# Patient Record
Sex: Female | Born: 1960 | Race: White | Hispanic: No | Marital: Married | Smoking: Never smoker
Health system: Southern US, Community
[De-identification: ages and names within clinical notes are randomized; demographics above are authoritative.]

## PROBLEM LIST (undated history)

## (undated) DIAGNOSIS — T8859XA Other complications of anesthesia, initial encounter: Secondary | ICD-10-CM

## (undated) DIAGNOSIS — J45909 Unspecified asthma, uncomplicated: Secondary | ICD-10-CM

## (undated) DIAGNOSIS — F419 Anxiety disorder, unspecified: Secondary | ICD-10-CM

## (undated) DIAGNOSIS — Z9889 Other specified postprocedural states: Secondary | ICD-10-CM

## (undated) DIAGNOSIS — F32A Depression, unspecified: Secondary | ICD-10-CM

---

## 2000-06-28 ENCOUNTER — Encounter: Admission: RE | Admit: 2000-06-28 | Discharge: 2000-06-28 | Payer: Self-pay | Admitting: Obstetrics and Gynecology

## 2000-06-28 ENCOUNTER — Encounter: Payer: Self-pay | Admitting: Obstetrics and Gynecology

## 2001-07-04 ENCOUNTER — Encounter: Payer: Self-pay | Admitting: Obstetrics and Gynecology

## 2001-07-04 ENCOUNTER — Encounter: Admission: RE | Admit: 2001-07-04 | Discharge: 2001-07-04 | Payer: Self-pay | Admitting: Obstetrics and Gynecology

## 2002-07-08 ENCOUNTER — Encounter: Admission: RE | Admit: 2002-07-08 | Discharge: 2002-07-08 | Payer: Self-pay | Admitting: Obstetrics and Gynecology

## 2002-07-08 ENCOUNTER — Encounter: Payer: Self-pay | Admitting: Obstetrics and Gynecology

## 2003-07-13 ENCOUNTER — Encounter: Admission: RE | Admit: 2003-07-13 | Discharge: 2003-07-13 | Payer: Self-pay | Admitting: Obstetrics and Gynecology

## 2004-07-22 ENCOUNTER — Encounter: Admission: RE | Admit: 2004-07-22 | Discharge: 2004-07-22 | Payer: Self-pay | Admitting: Obstetrics and Gynecology

## 2005-07-12 ENCOUNTER — Ambulatory Visit: Payer: Self-pay | Admitting: Gastroenterology

## 2005-08-11 ENCOUNTER — Ambulatory Visit: Payer: Self-pay | Admitting: Gastroenterology

## 2005-08-14 ENCOUNTER — Encounter: Admission: RE | Admit: 2005-08-14 | Discharge: 2005-08-14 | Payer: Self-pay | Admitting: Obstetrics and Gynecology

## 2005-08-17 ENCOUNTER — Ambulatory Visit: Payer: Self-pay | Admitting: Gastroenterology

## 2005-08-24 ENCOUNTER — Ambulatory Visit: Payer: Self-pay | Admitting: Gastroenterology

## 2005-09-29 ENCOUNTER — Ambulatory Visit: Payer: Self-pay | Admitting: Gastroenterology

## 2006-08-17 ENCOUNTER — Encounter: Admission: RE | Admit: 2006-08-17 | Discharge: 2006-08-17 | Payer: Self-pay | Admitting: Obstetrics and Gynecology

## 2007-09-12 ENCOUNTER — Encounter: Admission: RE | Admit: 2007-09-12 | Discharge: 2007-09-12 | Payer: Self-pay | Admitting: Obstetrics and Gynecology

## 2009-01-12 ENCOUNTER — Encounter: Admission: RE | Admit: 2009-01-12 | Discharge: 2009-01-12 | Payer: Self-pay | Admitting: Obstetrics and Gynecology

## 2010-04-03 ENCOUNTER — Emergency Department (HOSPITAL_COMMUNITY): Admission: EM | Admit: 2010-04-03 | Discharge: 2010-04-03 | Payer: Self-pay | Admitting: Emergency Medicine

## 2010-04-06 ENCOUNTER — Encounter: Admission: RE | Admit: 2010-04-06 | Discharge: 2010-04-06 | Payer: Self-pay | Admitting: Obstetrics and Gynecology

## 2010-07-10 ENCOUNTER — Encounter: Payer: Self-pay | Admitting: Obstetrics and Gynecology

## 2011-04-03 ENCOUNTER — Other Ambulatory Visit: Payer: Self-pay | Admitting: Obstetrics and Gynecology

## 2011-04-03 DIAGNOSIS — Z1231 Encounter for screening mammogram for malignant neoplasm of breast: Secondary | ICD-10-CM

## 2011-04-25 ENCOUNTER — Ambulatory Visit
Admission: RE | Admit: 2011-04-25 | Discharge: 2011-04-25 | Disposition: A | Discharge status: Home / Self Care | Payer: Managed Care, Other (non HMO) | Source: Ambulatory Visit | Attending: Obstetrics and Gynecology | Admitting: Obstetrics and Gynecology

## 2011-04-25 DIAGNOSIS — Z1231 Encounter for screening mammogram for malignant neoplasm of breast: Secondary | ICD-10-CM

## 2012-03-15 ENCOUNTER — Encounter: Payer: Self-pay | Admitting: Gastroenterology

## 2012-04-16 ENCOUNTER — Other Ambulatory Visit: Payer: Self-pay | Admitting: Obstetrics and Gynecology

## 2012-04-16 DIAGNOSIS — Z803 Family history of malignant neoplasm of breast: Secondary | ICD-10-CM

## 2012-04-16 DIAGNOSIS — Z1231 Encounter for screening mammogram for malignant neoplasm of breast: Secondary | ICD-10-CM

## 2012-05-17 ENCOUNTER — Ambulatory Visit
Admission: RE | Admit: 2012-05-17 | Discharge: 2012-05-17 | Disposition: A | Discharge status: Home / Self Care | Payer: Commercial Indemnity | Source: Ambulatory Visit | Attending: Obstetrics and Gynecology | Admitting: Obstetrics and Gynecology

## 2012-05-17 DIAGNOSIS — Z803 Family history of malignant neoplasm of breast: Secondary | ICD-10-CM

## 2012-05-17 DIAGNOSIS — Z1231 Encounter for screening mammogram for malignant neoplasm of breast: Secondary | ICD-10-CM

## 2012-06-19 HISTORY — PX: COLONOSCOPY: SHX174

## 2012-09-10 ENCOUNTER — Other Ambulatory Visit: Payer: Self-pay | Admitting: Family Medicine

## 2012-09-10 ENCOUNTER — Ambulatory Visit
Admission: RE | Admit: 2012-09-10 | Discharge: 2012-09-10 | Disposition: A | Discharge status: Home / Self Care | Payer: Self-pay | Source: Ambulatory Visit | Attending: Family Medicine | Admitting: Family Medicine

## 2012-09-10 DIAGNOSIS — R079 Chest pain, unspecified: Secondary | ICD-10-CM

## 2012-12-11 ENCOUNTER — Encounter (INDEPENDENT_AMBULATORY_CARE_PROVIDER_SITE_OTHER): Payer: Self-pay

## 2012-12-16 ENCOUNTER — Encounter (INDEPENDENT_AMBULATORY_CARE_PROVIDER_SITE_OTHER): Payer: Self-pay | Admitting: General Surgery

## 2012-12-16 ENCOUNTER — Ambulatory Visit (INDEPENDENT_AMBULATORY_CARE_PROVIDER_SITE_OTHER): Payer: BC Managed Care – PPO | Admitting: General Surgery

## 2012-12-16 VITALS — BP 110/80 | HR 82 | Resp 16 | Ht 65.5 in | Wt 152.4 lb

## 2012-12-16 DIAGNOSIS — R1031 Right lower quadrant pain: Secondary | ICD-10-CM

## 2012-12-16 MED ORDER — TRAMADOL HCL 50 MG PO TABS: 50.0000 mg | ORAL_TABLET | Freq: Four times a day (QID) | ORAL | Status: DC | PRN

## 2012-12-16 NOTE — Progress Notes (Signed)
Subjective:     Patient ID: Heidi King, female   DOB: 02-Nov-1960, 52 y.o.   MRN: [id number]  HPI We're asked to see the patient in consultation by Dr. Creta Levin to evaluate her for a right groin hernia. The patient is a 52 year old white female who first started having pain in the right groin about 2 months ago. She states that she didn't help her husband something heavy at home at about the same time. She does not recall feeling a pop or acute pain at the time of lifting the barrel. She has had some nausea associated with it. She denies any vomiting. Her stools are been regular but loose or the normal. She denies any fevers or chills.  Review of Systems  Constitutional: Negative.   HENT: Negative.   Eyes: Negative.   Respiratory: Negative.   Cardiovascular: Negative.   Gastrointestinal: Positive for abdominal pain and diarrhea.  Endocrine: Negative.   Genitourinary: Negative.   Musculoskeletal: Negative.   Skin: Negative.   Allergic/Immunologic: Negative.   Neurological: Negative.   Hematological: Negative.   Psychiatric/Behavioral: Negative.        Objective:   Physical Exam  Constitutional: She is oriented to person, place, and time. She appears well-developed and well-nourished.  HENT:  Head: Normocephalic and atraumatic.  Eyes: Conjunctivae and EOM are normal. Pupils are equal, round, and reactive to light.  Neck: Normal range of motion. Neck supple.  Cardiovascular: Normal rate, regular rhythm and normal heart sounds.   Pulmonary/Chest: Effort normal and breath sounds normal.  Abdominal: Soft. Bowel sounds are normal.  She has moderate to severe tenderness in the right groin with palpation. There is no obvious bulge or impulse with straining in the right groin. The rest of her abdomen is soft and nontender.  Musculoskeletal: Normal range of motion.  Neurological: She is alert and oriented to person, place, and time.  Skin: Skin is warm and dry.  Psychiatric: She has a  normal mood and affect. Her behavior is normal.       Assessment:     The patient is having significant right groin pain. Unfortunately I cannot appreciate a hernia on physical exam. Because of this I think she should undergo a CT scan of her pelvis to look for radiographic evidence of a hernia     Plan:     Plan for CT scan of the pelvis. We will call her with the results of the study and then proceed accordingly.

## 2012-12-16 NOTE — Patient Instructions (Signed)
Will get CT pelvis and call with results

## 2012-12-16 NOTE — Addendum Note (Signed)
Addended by: Caleen Essex III on: 12/16/2012 03:31 PM   Modules accepted: Orders

## 2012-12-23 ENCOUNTER — Ambulatory Visit
Admission: RE | Admit: 2012-12-23 | Discharge: 2012-12-23 | Disposition: A | Discharge status: Home / Self Care | Payer: BC Managed Care – PPO | Source: Ambulatory Visit | Attending: General Surgery | Admitting: General Surgery

## 2012-12-23 DIAGNOSIS — R1031 Right lower quadrant pain: Secondary | ICD-10-CM

## 2012-12-23 MED ORDER — IOHEXOL 300 MG/ML  SOLN
100.0000 mL | Freq: Once | INTRAMUSCULAR | Status: AC | PRN
  Administered 2012-12-23: 100 mL via INTRAVENOUS

## 2012-12-25 ENCOUNTER — Telehealth (INDEPENDENT_AMBULATORY_CARE_PROVIDER_SITE_OTHER): Payer: Self-pay

## 2012-12-25 NOTE — Telephone Encounter (Signed)
LMOM. CT results from this past Monday were normal. No hernia found or anything to account for her pain that needs surgical correction.

## 2012-12-25 NOTE — Telephone Encounter (Signed)
Message copied by Brennan Bailey on Wed Dec 25, 2012  3:28 PM ------      Message from: Louie Casa      Created: Wed Dec 25, 2012 11:12 AM      Regarding: Dr. Carolynne Edouard CT scan results      Contact: [phone]       Patient had Ct scan last Monday and no one call her with the results she wants a call Today please.      Thank you. ------

## 2012-12-26 ENCOUNTER — Telehealth (INDEPENDENT_AMBULATORY_CARE_PROVIDER_SITE_OTHER): Payer: Self-pay

## 2012-12-26 NOTE — Telephone Encounter (Signed)
Called patient back and gave her negative CT results.

## 2013-06-23 ENCOUNTER — Other Ambulatory Visit: Payer: Self-pay

## 2013-06-23 DIAGNOSIS — Z1231 Encounter for screening mammogram for malignant neoplasm of breast: Secondary | ICD-10-CM

## 2013-07-11 ENCOUNTER — Ambulatory Visit
Admission: RE | Admit: 2013-07-11 | Discharge: 2013-07-11 | Disposition: A | Discharge status: Home / Self Care | Payer: BC Managed Care – PPO | Source: Ambulatory Visit

## 2013-07-11 DIAGNOSIS — Z1231 Encounter for screening mammogram for malignant neoplasm of breast: Secondary | ICD-10-CM

## 2014-06-05 ENCOUNTER — Other Ambulatory Visit: Payer: Self-pay

## 2014-06-05 DIAGNOSIS — Z1231 Encounter for screening mammogram for malignant neoplasm of breast: Secondary | ICD-10-CM

## 2014-07-13 ENCOUNTER — Ambulatory Visit
Admission: RE | Admit: 2014-07-13 | Discharge: 2014-07-13 | Disposition: A | Discharge status: Home / Self Care | Payer: BLUE CROSS/BLUE SHIELD | Source: Ambulatory Visit

## 2014-07-13 DIAGNOSIS — Z1231 Encounter for screening mammogram for malignant neoplasm of breast: Secondary | ICD-10-CM

## 2015-09-10 ENCOUNTER — Other Ambulatory Visit: Payer: Self-pay

## 2015-09-10 DIAGNOSIS — Z1231 Encounter for screening mammogram for malignant neoplasm of breast: Secondary | ICD-10-CM

## 2015-09-23 ENCOUNTER — Ambulatory Visit
Admission: RE | Admit: 2015-09-23 | Discharge: 2015-09-23 | Disposition: A | Discharge status: Home / Self Care | Payer: BLUE CROSS/BLUE SHIELD | Source: Ambulatory Visit

## 2015-09-23 DIAGNOSIS — Z1231 Encounter for screening mammogram for malignant neoplasm of breast: Secondary | ICD-10-CM

## 2016-10-23 ENCOUNTER — Other Ambulatory Visit: Payer: Self-pay | Admitting: Obstetrics and Gynecology

## 2016-10-23 DIAGNOSIS — Z1231 Encounter for screening mammogram for malignant neoplasm of breast: Secondary | ICD-10-CM

## 2016-11-15 ENCOUNTER — Ambulatory Visit
Admission: RE | Admit: 2016-11-15 | Discharge: 2016-11-15 | Disposition: A | Discharge status: Home / Self Care | Payer: BLUE CROSS/BLUE SHIELD | Source: Ambulatory Visit | Attending: Obstetrics and Gynecology | Admitting: Obstetrics and Gynecology

## 2016-11-15 DIAGNOSIS — Z1231 Encounter for screening mammogram for malignant neoplasm of breast: Secondary | ICD-10-CM

## 2017-04-13 ENCOUNTER — Other Ambulatory Visit: Payer: Self-pay | Admitting: Physician Assistant

## 2017-04-13 ENCOUNTER — Ambulatory Visit
Admission: RE | Admit: 2017-04-13 | Discharge: 2017-04-13 | Disposition: A | Discharge status: Home / Self Care | Payer: BLUE CROSS/BLUE SHIELD | Source: Ambulatory Visit | Attending: Physician Assistant | Admitting: Physician Assistant

## 2017-04-13 DIAGNOSIS — M545 Low back pain, unspecified: Secondary | ICD-10-CM

## 2017-06-19 HISTORY — PX: KNEE ARTHROSCOPY: SHX127

## 2017-11-28 ENCOUNTER — Other Ambulatory Visit: Payer: Self-pay | Admitting: Obstetrics and Gynecology

## 2017-11-28 DIAGNOSIS — R928 Other abnormal and inconclusive findings on diagnostic imaging of breast: Secondary | ICD-10-CM

## 2017-11-30 ENCOUNTER — Ambulatory Visit
Admission: RE | Admit: 2017-11-30 | Discharge: 2017-11-30 | Disposition: A | Discharge status: Home / Self Care | Payer: BLUE CROSS/BLUE SHIELD | Source: Ambulatory Visit | Attending: Obstetrics and Gynecology | Admitting: Obstetrics and Gynecology

## 2017-11-30 ENCOUNTER — Ambulatory Visit: Payer: BLUE CROSS/BLUE SHIELD

## 2017-11-30 DIAGNOSIS — R928 Other abnormal and inconclusive findings on diagnostic imaging of breast: Secondary | ICD-10-CM

## 2018-07-02 ENCOUNTER — Other Ambulatory Visit: Payer: Self-pay | Admitting: Neurosurgery

## 2018-07-02 DIAGNOSIS — M5441 Lumbago with sciatica, right side: Secondary | ICD-10-CM

## 2018-07-04 ENCOUNTER — Ambulatory Visit
Admission: RE | Admit: 2018-07-04 | Discharge: 2018-07-04 | Disposition: A | Discharge status: Home / Self Care | Payer: BLUE CROSS/BLUE SHIELD | Source: Ambulatory Visit | Attending: Neurosurgery | Admitting: Neurosurgery

## 2018-07-04 DIAGNOSIS — M5441 Lumbago with sciatica, right side: Secondary | ICD-10-CM

## 2018-09-30 ENCOUNTER — Other Ambulatory Visit: Payer: Self-pay | Admitting: Physician Assistant

## 2018-09-30 ENCOUNTER — Ambulatory Visit
Admission: RE | Admit: 2018-09-30 | Discharge: 2018-09-30 | Disposition: A | Discharge status: Home / Self Care | Payer: BLUE CROSS/BLUE SHIELD | Source: Ambulatory Visit | Attending: Physician Assistant | Admitting: Physician Assistant

## 2018-09-30 ENCOUNTER — Other Ambulatory Visit: Payer: Self-pay

## 2018-09-30 DIAGNOSIS — R059 Cough, unspecified: Secondary | ICD-10-CM

## 2018-09-30 DIAGNOSIS — R05 Cough: Secondary | ICD-10-CM

## 2019-01-15 ENCOUNTER — Other Ambulatory Visit: Payer: Self-pay | Admitting: Neurosurgery

## 2019-01-15 DIAGNOSIS — M47816 Spondylosis without myelopathy or radiculopathy, lumbar region: Secondary | ICD-10-CM

## 2019-01-20 ENCOUNTER — Other Ambulatory Visit: Payer: Self-pay

## 2019-01-20 ENCOUNTER — Ambulatory Visit
Admission: RE | Admit: 2019-01-20 | Discharge: 2019-01-20 | Disposition: A | Discharge status: Home / Self Care | Payer: BLUE CROSS/BLUE SHIELD | Source: Ambulatory Visit | Attending: Neurosurgery | Admitting: Neurosurgery

## 2019-01-20 DIAGNOSIS — M47816 Spondylosis without myelopathy or radiculopathy, lumbar region: Secondary | ICD-10-CM

## 2019-01-27 ENCOUNTER — Other Ambulatory Visit: Payer: BLUE CROSS/BLUE SHIELD

## 2019-01-28 ENCOUNTER — Other Ambulatory Visit: Payer: BLUE CROSS/BLUE SHIELD

## 2019-09-03 ENCOUNTER — Ambulatory Visit
Admission: RE | Admit: 2019-09-03 | Discharge: 2019-09-03 | Disposition: A | Discharge status: Home / Self Care | Payer: BLUE CROSS/BLUE SHIELD | Source: Ambulatory Visit | Attending: Physician Assistant | Admitting: Physician Assistant

## 2019-09-03 ENCOUNTER — Other Ambulatory Visit: Payer: Self-pay

## 2019-09-03 ENCOUNTER — Other Ambulatory Visit: Payer: Self-pay | Admitting: Physician Assistant

## 2019-09-03 DIAGNOSIS — R52 Pain, unspecified: Secondary | ICD-10-CM

## 2020-04-17 IMAGING — CR DG KNEE 1-2V*R*
2 series · 2 of 2 positions shown · non-contrast
Comparison: None.

CLINICAL DATA: Chronic right medial knee pain

EXAM:
RIGHT KNEE - 1-2 VIEW

[w knee ap right]
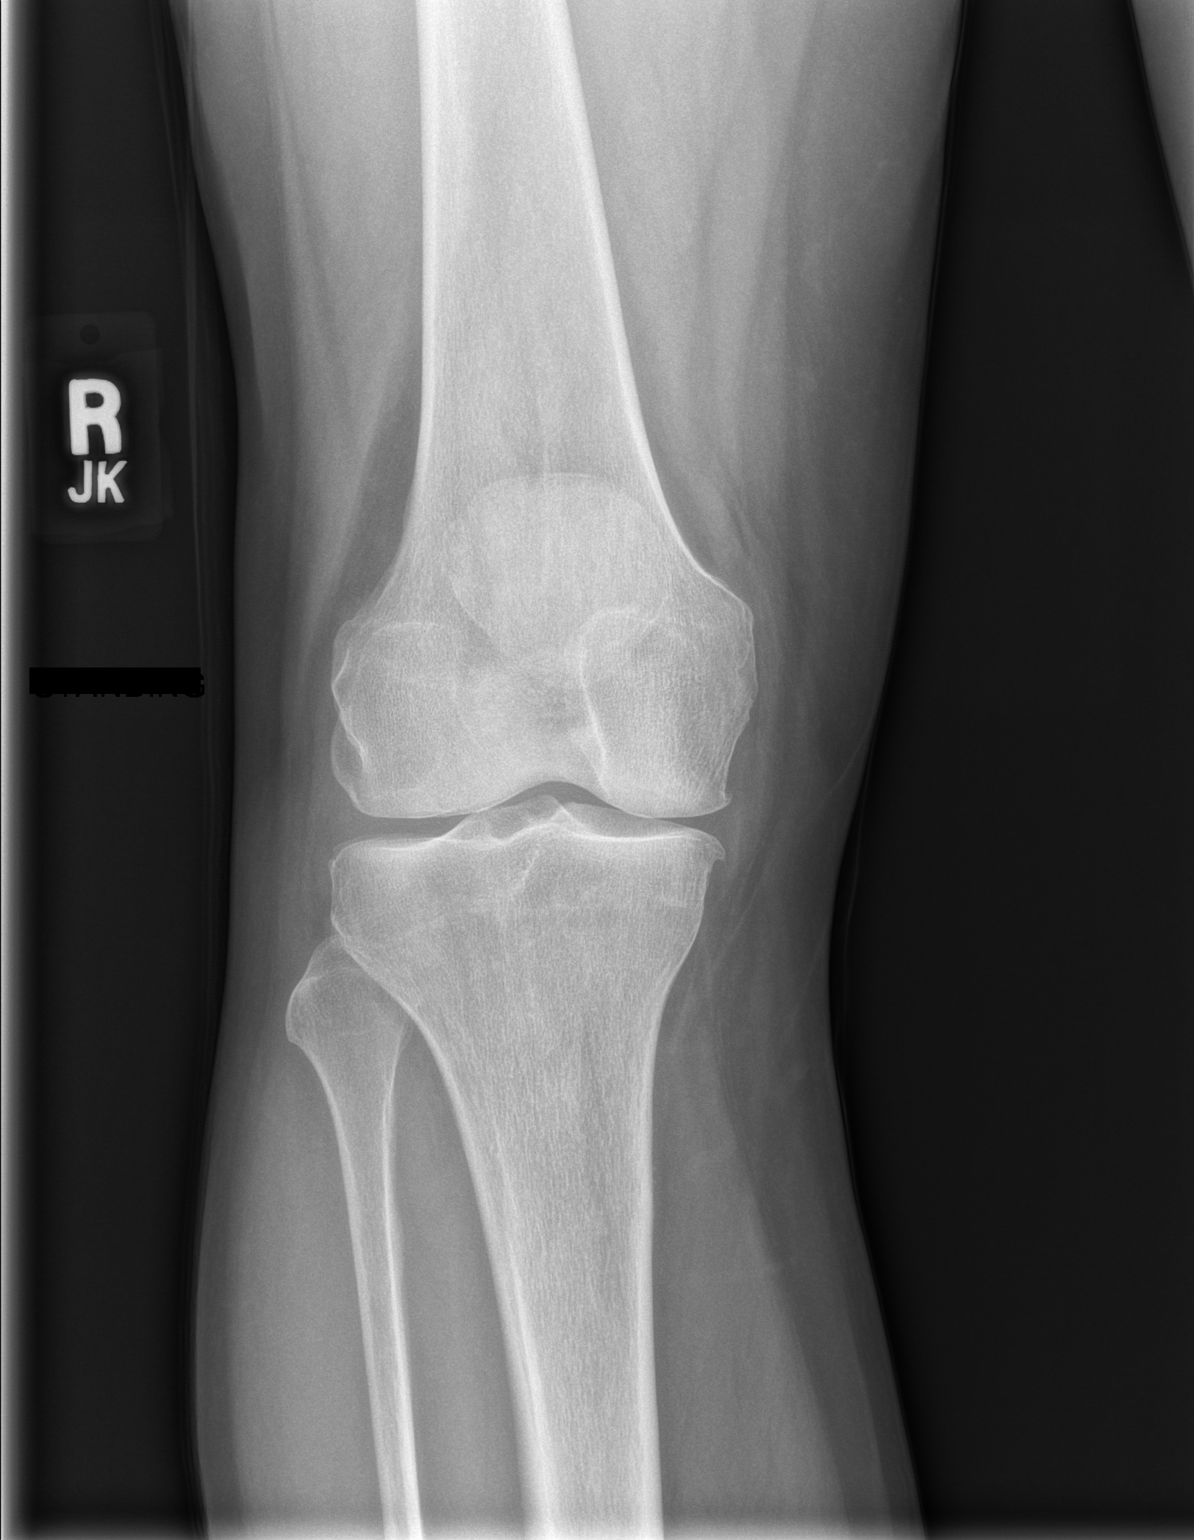

[w knee lat right]
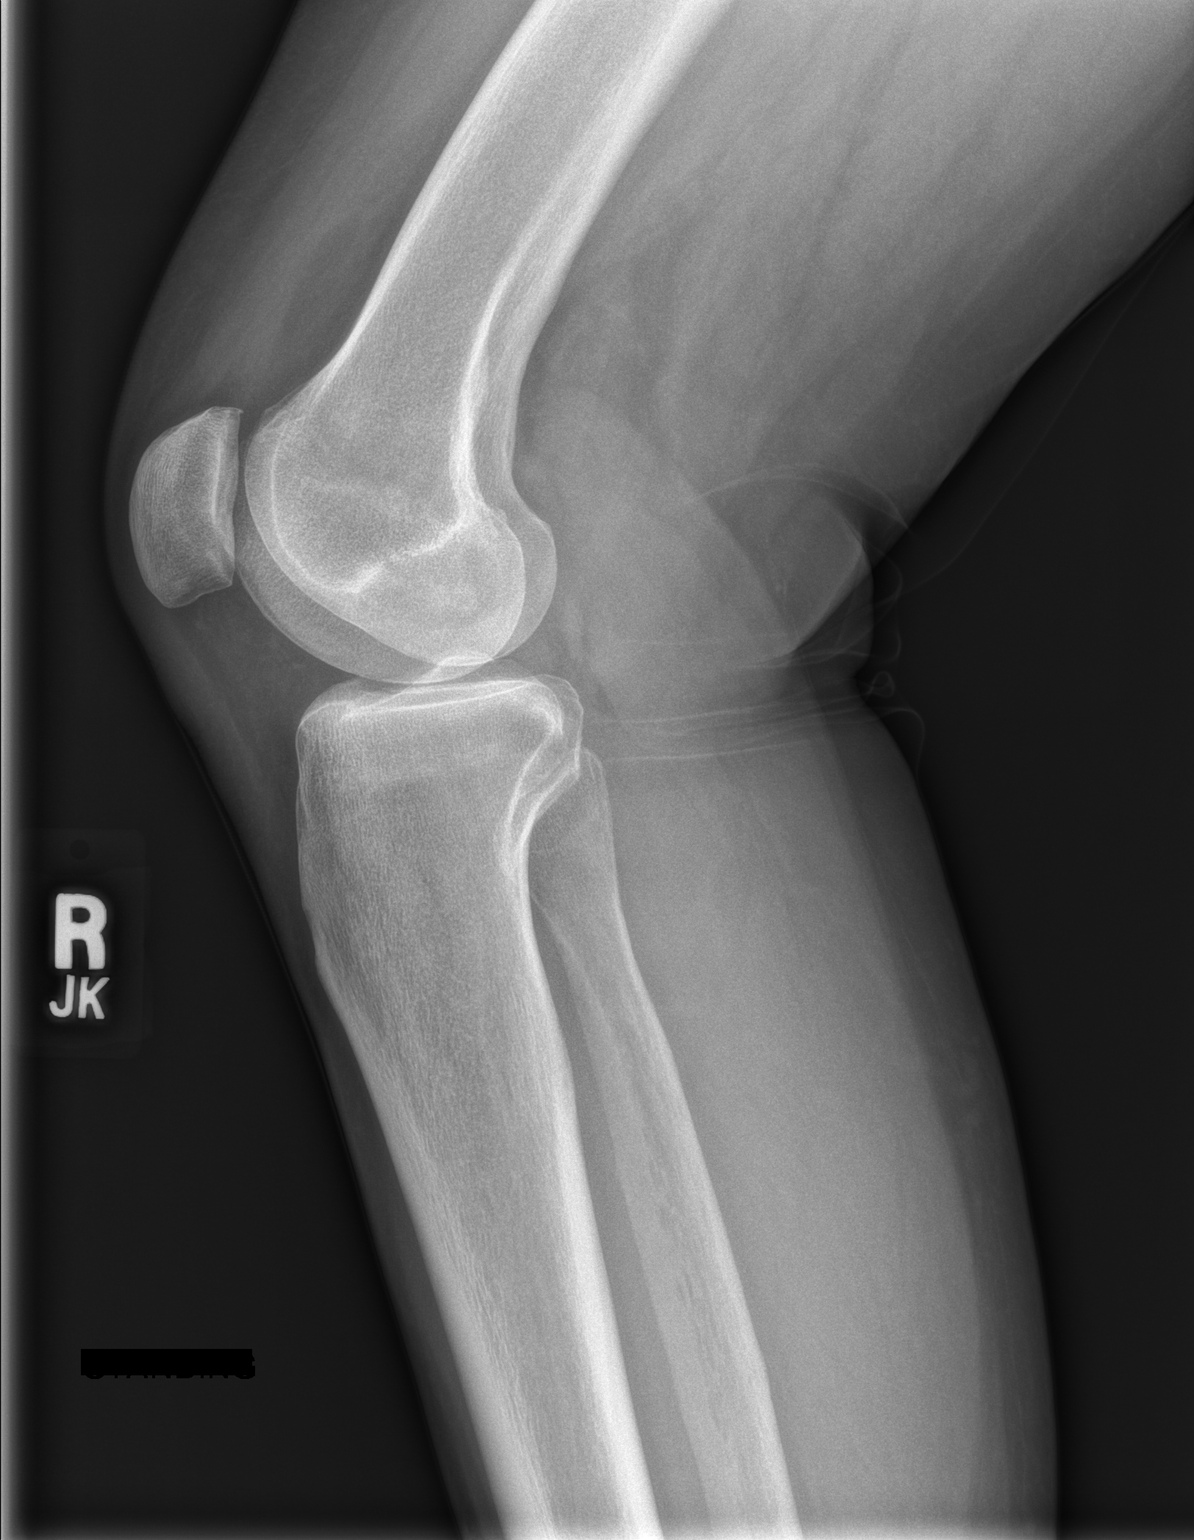

[2 of 2 positions shown; findings below may reference images not displayed]

FINDINGS: Are tricompartmental degenerative changes, greatest within the
medial compartment. There is no significant joint effusion. There is
no acute displaced fracture or dislocation.
IMPRESSION: Tricompartmental degenerative changes, greatest within the medial
compartment.

## 2020-07-29 ENCOUNTER — Other Ambulatory Visit (HOSPITAL_COMMUNITY): Payer: Self-pay | Admitting: Orthopedic Surgery

## 2020-07-29 ENCOUNTER — Ambulatory Visit (HOSPITAL_COMMUNITY)
Admission: RE | Admit: 2020-07-29 | Discharge: 2020-07-29 | Disposition: A | Discharge status: Home / Self Care | Payer: BC Managed Care – PPO | Source: Ambulatory Visit | Attending: Orthopedic Surgery | Admitting: Orthopedic Surgery

## 2020-07-29 ENCOUNTER — Other Ambulatory Visit: Payer: Self-pay

## 2020-07-29 DIAGNOSIS — R600 Localized edema: Secondary | ICD-10-CM | POA: Diagnosis not present

## 2020-07-29 DIAGNOSIS — M79605 Pain in left leg: Secondary | ICD-10-CM | POA: Diagnosis not present

## 2021-01-09 ENCOUNTER — Other Ambulatory Visit: Payer: Self-pay

## 2021-01-09 ENCOUNTER — Emergency Department (HOSPITAL_COMMUNITY)
Admission: EM | Admit: 2021-01-09 | Discharge: 2021-01-09 | Disposition: A | Discharge status: Home / Self Care | Payer: Worker's Compensation | Attending: Emergency Medicine | Admitting: Emergency Medicine

## 2021-01-09 DIAGNOSIS — Y990 Civilian activity done for income or pay: Secondary | ICD-10-CM | POA: Diagnosis not present

## 2021-01-09 DIAGNOSIS — S76311A Strain of muscle, fascia and tendon of the posterior muscle group at thigh level, right thigh, initial encounter: Secondary | ICD-10-CM | POA: Diagnosis not present

## 2021-01-09 DIAGNOSIS — W010XXA Fall on same level from slipping, tripping and stumbling without subsequent striking against object, initial encounter: Secondary | ICD-10-CM | POA: Insufficient documentation

## 2021-01-09 DIAGNOSIS — S79921A Unspecified injury of right thigh, initial encounter: Secondary | ICD-10-CM | POA: Diagnosis present

## 2021-01-09 MED ORDER — KETOROLAC TROMETHAMINE 60 MG/2ML IM SOLN
60.0000 mg | Freq: Once | INTRAMUSCULAR | Status: AC
  Administered 2021-01-09: 60 mg via INTRAMUSCULAR
  Filled 2021-01-09: qty 2

## 2021-01-09 NOTE — ED Provider Notes (Signed)
[facility]-EMERGENCY DEPT Provider Note   CSN: [id number] Arrival date & time: 01/09/21  1913     History Chief Complaint  Patient presents with  . Leg Pain    Heidi King is a 60 y.o. female.  Patient is a 60 year old female with a history of sciatica presenting to the ED for pain in the right leg. Patient slipped and fell at work into a splits position, with her right leg going backwards, at 4pm today. She immediately felt a pulling sensation in her right posterior thigh. She describes the pain as sharp and stabbing. The pain has been constant, and it intermittently radiates to the right calf. She is able to walk, though she reports it is difficult. She has taken Tylenol, which she reports has not helped. She does not report any weakness or numbness.  No other area of injury.  The history is provided by the patient.  Leg Pain Location:  Buttock and leg Time since incident:  5 hours Injury: yes   Mechanism of injury: fall   Fall:    Fall occurred:  Tripped Buttock location:  R buttock Leg location:  R upper leg Pain details:    Quality:  Sharp   Radiates to:  R leg   Severity:  Severe   Onset quality:  Sudden   Timing:  Constant   Progression:  Unchanged Chronicity:  New Dislocation: no   Prior injury to area:  No Relieved by:  Rest Worsened by:  Bearing weight Ineffective treatments:  Acetaminophen Associated symptoms: stiffness   Associated symptoms: no back pain, no muscle weakness and no numbness       No past medical history on file.  Patient Active Problem List   Diagnosis Date Noted  . Right groin pain 12/16/2012    No past surgical history on file.   OB History   No obstetric history on file.     Family History  Problem Relation Age of Onset  . Breast cancer Mother   . Breast cancer Maternal Aunt     Social History   Tobacco Use  . Smoking status: Never  . Smokeless tobacco: Never  Substance Use Topics  . Alcohol  use: No  . Drug use: No    Home Medications Prior to Admission medications   Medication Sig Start Date End Date Taking? Authorizing Provider  albuterol (PROVENTIL) (2.5 MG/3ML) 0.083% nebulizer solution Take 2.5 mg by nebulization every 6 (six) hours as needed for wheezing.    [provider]  traMADol (ULTRAM) 50 MG tablet Take 50 mg by mouth every 6 (six) hours as needed for pain.    [provider]  traMADol (ULTRAM) 50 MG tablet Take 1-2 tablets (50-100 mg total) by mouth every 6 (six) hours as needed for pain. 12/16/12   Griselda Miner, MD    Allergies    Augmentin [amoxicillin-pot clavulanate] and Sulfa antibiotics  Review of Systems   Review of Systems  Gastrointestinal:  Positive for nausea.  Musculoskeletal:  Positive for myalgias and stiffness. Negative for arthralgias, back pain, gait problem and joint swelling.  Skin:  Negative for color change, rash and wound.  Neurological:  Negative for weakness and numbness.  All other systems reviewed and are negative.  Physical Exam Updated Vital Signs BP 117/85   Pulse 75   Temp 98.6 F (37 C) (Oral)   Resp 18   Ht 5\' 5"  (1.651 m)   Wt 70.3 kg   LMP  06/15/2014   SpO2 98%   BMI 25.79 kg/m   Physical Exam Constitutional:      Appearance: Normal appearance.  HENT:     Head: Normocephalic and atraumatic.  Eyes:     Extraocular Movements: Extraocular movements intact.     Conjunctiva/sclera: Conjunctivae normal.  Cardiovascular:     Pulses: Normal pulses.  Pulmonary:     Effort: Pulmonary effort is normal.  Musculoskeletal:        General: Tenderness and signs of injury present. No swelling.     Right upper leg: Tenderness present.     Right lower leg: No edema.     Left lower leg: No edema.       Legs:     Comments: No ecchymosis noted.  Full strength and flexion/ext at the knee and hip.  Normal plantar and dorsiflexion  Skin:    General: Skin is warm and dry.  Neurological:     Mental  Status: She is alert and oriented to person, place, and time. Mental status is at baseline.  Psychiatric:        Mood and Affect: Mood normal.        Behavior: Behavior normal.    ED Results / Procedures / Treatments   Labs (all labs ordered are listed, but only abnormal results are displayed) Labs Reviewed - No data to display  EKG None  Radiology No results found.  Procedures Procedures   Medications Ordered in ED Medications  ketorolac (TORADOL) injection 60 mg (has no administration in time range)    ED Course  I have reviewed the triage vital signs and the nursing notes.  Pertinent labs & imaging results that were available during my care of the patient were reviewed by me and considered in my medical decision making (see chart for details).    MDM Rules/Calculators/A&P                           Heidi King is a 60 year old female who presented to the ED for right leg pain following a fall at work. This pain is likely secondary to a muscle strain in either the hamstring or gluteus maximus, due to the location of her pain, the fact that her pain started immediately after her fall, and remains tender to palpation. Neuropathic pain is less likely because of the characterization of her pain and lack of low back pain. Bone injury is less likely because of her ability to bear weight and lack of focal tenderness in the hip area with low suspicion for femur fracture. Counseled patient on use of ice/ heat, rest, and stretching. For pain management, administer IM Toradol and advised patient to take 2 Tylenol/ 2 Ibuprofen q6h PRN.   Final Clinical Impression(s) / ED Diagnoses Final diagnoses:  Hamstring strain, right, initial encounter    Rx / DC Orders ED Discharge Orders     None        Gwyneth Sprout, MD 01/09/21 2320

## 2021-01-09 NOTE — ED Triage Notes (Signed)
Pt here with workers compensation paperwork from [facility] after falling and "doing a split," while at work. Pt is now c/o upper right leg pain. Pt states she fell around 1630. Pt states after the event she feels sick on her stomach, c/o dizziness and trouble ambulating. Pt denies hitting her head or LOC. Pt is A&Ox4.

## 2021-01-09 NOTE — Discharge Instructions (Addendum)
Take 2 tylenol and 2 ibuprofen every 6 hours as needed for pain and try heat/ice and lidocaine patches.  Stretch regularly

## 2021-01-11 ENCOUNTER — Other Ambulatory Visit: Payer: Self-pay | Admitting: Orthopedic Surgery

## 2021-01-11 DIAGNOSIS — Z01811 Encounter for preprocedural respiratory examination: Secondary | ICD-10-CM

## 2021-01-20 ENCOUNTER — Other Ambulatory Visit: Payer: Self-pay | Admitting: Orthopedic Surgery

## 2021-01-20 DIAGNOSIS — M25512 Pain in left shoulder: Secondary | ICD-10-CM

## 2021-02-05 ENCOUNTER — Ambulatory Visit
Admission: RE | Admit: 2021-02-05 | Discharge: 2021-02-05 | Disposition: A | Discharge status: Home / Self Care | Payer: BC Managed Care – PPO | Source: Ambulatory Visit | Attending: Orthopedic Surgery | Admitting: Orthopedic Surgery

## 2021-02-05 DIAGNOSIS — M25512 Pain in left shoulder: Secondary | ICD-10-CM

## 2021-02-24 NOTE — Patient Instructions (Signed)
DUE TO COVID-19 ONLY ONE VISITOR IS ALLOWED TO COME WITH YOU AND STAY IN THE WAITING ROOM ONLY DURING PRE OP AND PROCEDURE DAY OF SURGERY IF YOU ARE GOING HOME AFTER SURGERY. IF YOU ARE SPENDING THE NIGHT 2 PEOPLE MAY VISIT WITH YOU IN YOUR PRIVATE ROOM AFTER SURGERY UNTIL VISITING  HOURS ARE OVER AT 8:00 PM AND THE 2 VISITORS CANNOT SPEND THE NIGHT.                 Sophia Wilmot     Your procedure is scheduled on: 03/10/21   Report to [facility] Main  Entrance   Report to admitting at 6:50 AM     Call this number if you have problems the morning of surgery [phone]   No food after midnight.    You may have clear liquid until 6:30 AM.    At 5:00 AM drink pre surgery drink.   Nothing by mouth after 6:30 AM.    CLEAR LIQUID DIET   Foods Allowed                                                                     Foods Excluded  Coffee and tea, regular and decaf                             NO MILK OR CREAMER Plain Jell-O any favor except red or purple                                           see through such as: Fruit ices (not with fruit pulp)                                     milk, soups, orange juice  Iced Popsicles                                    All solid food Carbonated beverages, regular and diet                                    Cranberry, grape and apple juices Sports drinks like Gatorade Lightly seasoned clear broth or consume(fat free) Sugar    BRUSH YOUR TEETH MORNING OF SURGERY AND RINSE YOUR MOUTH OUT, NO CHEWING GUM CANDY OR MINTS.     Take these medicines the morning of surgery with A SIP OF WATER: Zoloft, Xanax if needed, Use your inhaler if needed and bring it with you                                You may not have any metal on your body including hair pins and              piercings  Do not wear jewelry, make-up, lotions, powders or  perfumes, deodorant             Do not wear nail polish on your fingernails.  Do not shave  48 hours  prior to surgery.               Do not bring valuables to the hospital. [facility] IS NOT             RESPONSIBLE   FOR VALUABLES.  Contacts, dentures or bridgework may not be worn into surgery.  .     Patients discharged the day of surgery will not be allowed to drive home.  IF YOU ARE HAVING SURGERY AND GOING HOME THE SAME DAY, YOU MUST HAVE AN ADULT TO DRIVE YOU HOME AND BE WITH YOU FOR 24 HOURS.  YOU MAY GO HOME BY TAXI OR UBER OR ORTHERWISE, BUT AN ADULT MUST ACCOMPANY YOU HOME AND STAY WITH YOU FOR 24 HOURS.  Name and phone number of your driver:  Special Instructions: N/A              Please read over the following fact sheets you were given: _____________________________________________________________________ [facility]- Preparing for Total Shoulder Arthroplasty    Before surgery, you can play an important role. Because skin is not sterile, your skin needs to be as free of germs as possible. You can reduce the number of germs on your skin by using the following products. Benzoyl Peroxide Gel Reduces the number of germs present on the skin Applied twice a day to shoulder area starting two days before surgery    ==================================================================  Please follow these instructions carefully:  BENZOYL PEROXIDE 5% GEL  Please do not use if you have an allergy to benzoyl peroxide.   If your skin becomes reddened/irritated stop using the benzoyl peroxide.  Starting two days before surgery, apply as follows: Apply benzoyl peroxide in the morning and at night. Apply after taking a shower. If you are not taking a shower clean entire shoulder front, back, and side along with the armpit with a clean wet washcloth.  Place a quarter-sized dollop on your shoulder and rub in thoroughly, making sure to cover the front, back, and side of your shoulder, along with the armpit.   2 days before ____ AM   ____ PM              1 day before ____ AM   ____ PM                          Do this twice a day for two days.  (Last application is the night before surgery, AFTER using the CHG soap as described below).  Do NOT apply benzoyl peroxide gel on the day of surgery.    [facility] - Preparing for Surgery Before surgery, you can play an important role.  Because skin is not sterile, your skin needs to be as free of germs as possible.  You can reduce the number of germs on your skin by washing with CHG (chlorahexidine gluconate) soap before surgery.  CHG is an antiseptic cleaner which kills germs and bonds with the skin to continue killing germs even after washing. Please DO NOT use if you have an allergy to CHG or antibacterial soaps.  If your skin becomes reddened/irritated stop using the CHG and inform your nurse when you arrive at Short Stay. Do not shave (including legs and underarms) for at least 48 hours prior to the first  CHG shower.  You may shave your face/neck. Please follow these instructions carefully:  1.  Shower with CHG Soap the night before surgery and the  morning of Surgery.  2.  If you choose to wash your hair, wash your hair first as usual with your  normal  shampoo.  3.  After you shampoo, rinse your hair and body thoroughly to remove the  shampoo.                            4.  Use CHG as you would any other liquid soap.  You can apply chg directly  to the skin and wash                       Gently with a scrungie or clean washcloth.  5.  Apply the CHG Soap to your body ONLY FROM THE NECK DOWN.   Do not use on face/ open                           Wound or open sores. Avoid contact with eyes, ears mouth and genitals (private parts).                       Wash face,  Genitals (private parts) with your normal soap.             6.  Wash thoroughly, paying special attention to the area where your surgery  will be performed.  7.  Thoroughly rinse your body with warm water from the neck down.  8.  DO NOT shower/wash with your normal soap  after using and rinsing off  the CHG Soap.                9.  Pat yourself dry with a clean towel.            10.  Wear clean pajamas.            11.  Place clean sheets on your bed the night of your first shower and do not  sleep with pets. Day of Surgery : Do not apply any lotions/deodorants the morning of surgery.  Please wear clean clothes to the hospital/surgery center.  FAILURE TO FOLLOW THESE INSTRUCTIONS MAY RESULT IN THE CANCELLATION OF YOUR SURGERY PATIENT SIGNATURE_________________________________  NURSE SIGNATURE__________________________________  ________________________________________________________________________

## 2021-02-25 ENCOUNTER — Other Ambulatory Visit: Payer: Self-pay

## 2021-02-25 ENCOUNTER — Ambulatory Visit (HOSPITAL_COMMUNITY)
Admission: RE | Admit: 2021-02-25 | Discharge: 2021-02-25 | Disposition: A | Discharge status: Home / Self Care | Payer: BC Managed Care – PPO | Source: Ambulatory Visit | Attending: Orthopedic Surgery | Admitting: Orthopedic Surgery

## 2021-02-25 ENCOUNTER — Encounter (HOSPITAL_COMMUNITY)
Admission: RE | Admit: 2021-02-25 | Discharge: 2021-02-25 | Disposition: A | Discharge status: Home / Self Care | Payer: BC Managed Care – PPO | Source: Ambulatory Visit | Attending: Orthopedic Surgery | Admitting: Orthopedic Surgery

## 2021-02-25 ENCOUNTER — Encounter (HOSPITAL_COMMUNITY): Payer: Self-pay

## 2021-02-25 DIAGNOSIS — Z01811 Encounter for preprocedural respiratory examination: Secondary | ICD-10-CM | POA: Diagnosis not present

## 2021-02-25 HISTORY — DX: Unspecified asthma, uncomplicated: J45.909

## 2021-02-25 HISTORY — DX: Other complications of anesthesia, initial encounter: T88.59XA

## 2021-02-25 HISTORY — DX: Other specified postprocedural states: Z98.890

## 2021-02-25 HISTORY — DX: Anxiety disorder, unspecified: F41.9

## 2021-02-25 HISTORY — DX: Depression, unspecified: F32.A

## 2021-02-25 LAB — COMPREHENSIVE METABOLIC PANEL
ALT: 24 U/L (ref 0–44)
AST: 23 U/L (ref 15–41)
Albumin: 4.1 g/dL (ref 3.5–5.0)
Alkaline Phosphatase: 59 U/L (ref 38–126)
Anion gap: 8 (ref 5–15)
BUN: 20 mg/dL (ref 6–20)
CO2: 25 mmol/L (ref 22–32)
Calcium: 10.1 mg/dL (ref 8.9–10.3)
Chloride: 108 mmol/L (ref 98–111)
Creatinine, Ser: 0.87 mg/dL (ref 0.44–1.00)
GFR, Estimated: >60 mL/min (ref >60)
Glucose, Bld: 82 mg/dL (ref 70–99)
Potassium: 4.8 mmol/L (ref 3.5–5.1)
Sodium: 141 mmol/L (ref 135–145)
Total Bilirubin: 0.6 mg/dL (ref 0.3–1.2)
Total Protein: 7.7 g/dL (ref 6.5–8.1)

## 2021-02-25 LAB — TYPE AND SCREEN
ABO/RH(D): O POS
Antibody Screen: NEGATIVE

## 2021-02-25 LAB — URINALYSIS, ROUTINE W REFLEX MICROSCOPIC
Bilirubin Urine: NEGATIVE
Glucose, UA: NEGATIVE mg/dL
Hgb urine dipstick: NEGATIVE
Ketones, ur: NEGATIVE mg/dL
Leukocytes,Ua: NEGATIVE
Nitrite: NEGATIVE
Protein, ur: NEGATIVE mg/dL
Specific Gravity, Urine: 1.015 (ref 1.005–1.030)
pH: 6 (ref 5.0–8.0)

## 2021-02-25 LAB — CBC WITH DIFFERENTIAL/PLATELET
Abs Immature Granulocytes: 0.02 10*3/uL (ref 0.00–0.07)
Basophils Absolute: 0.1 10*3/uL (ref 0.0–0.1)
Basophils Relative: 1 %
Eosinophils Absolute: 0.4 10*3/uL (ref 0.0–0.5)
Eosinophils Relative: 6 %
HCT: 44.5 % (ref 36.0–46.0)
Hemoglobin: 14.4 g/dL (ref 12.0–15.0)
Immature Granulocytes: 0 %
Lymphocytes Relative: 30 %
Lymphs Abs: 2.1 10*3/uL (ref 0.7–4.0)
MCH: 32 pg (ref 26.0–34.0)
MCHC: 32.4 g/dL (ref 30.0–36.0)
MCV: 98.9 fL (ref 80.0–100.0)
Monocytes Absolute: 0.6 10*3/uL (ref 0.1–1.0)
Monocytes Relative: 8 %
Neutro Abs: 3.9 10*3/uL (ref 1.7–7.7)
Neutrophils Relative %: 55 %
Platelets: 366 10*3/uL (ref 150–400)
RBC: 4.5 MIL/uL (ref 3.87–5.11)
RDW: 12.6 % (ref 11.5–15.5)
WBC: 7.1 10*3/uL (ref 4.0–10.5)
nRBC: 0 % (ref 0.0–0.2)

## 2021-02-25 LAB — APTT: aPTT: 26 seconds (ref 24–36)

## 2021-02-25 LAB — SURGICAL PCR SCREEN
MRSA, PCR: NEGATIVE
Staphylococcus aureus: POSITIVE — AB

## 2021-02-25 NOTE — Progress Notes (Signed)
COVID test Completed:NA Pt is AMB  PCP - Dr. Ethlyn Gallery Cardiologist - none  Chest x-ray - no EKG - no Stress Test - no ECHO - no Cardiac Cath - no Pacemaker/ICD device last checked:NA  Sleep Study - no CPAP -   Fasting Blood Sugar - NA Checks Blood Sugar _____ times a day  Blood Thinner Instructions:NA Aspirin Instructions: Last Dose:  Anesthesia review: no  Patient denies shortness of breath, fever, cough and chest pain at PAT appointment Pt has no SOB with any activities. 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  Patient verbalized understanding of instructions that were given to them at the PAT appointment. Patient was also instructed that they will need to review over the PAT instructions again at home before surgery. yes

## 2021-03-10 ENCOUNTER — Encounter (HOSPITAL_COMMUNITY): Payer: Self-pay | Admitting: Orthopedic Surgery

## 2021-03-10 ENCOUNTER — Ambulatory Visit (HOSPITAL_COMMUNITY): Payer: BC Managed Care – PPO | Admitting: Anesthesiology

## 2021-03-10 ENCOUNTER — Ambulatory Visit (HOSPITAL_COMMUNITY): Payer: BC Managed Care – PPO

## 2021-03-10 ENCOUNTER — Ambulatory Visit (HOSPITAL_COMMUNITY)
Admission: RE | Admit: 2021-03-10 | Discharge: 2021-03-10 | Disposition: A | Discharge status: Home / Self Care | Payer: BC Managed Care – PPO | Attending: Orthopedic Surgery | Admitting: Orthopedic Surgery

## 2021-03-10 ENCOUNTER — Encounter (HOSPITAL_COMMUNITY)
Admission: RE | Disposition: A | Discharge status: Home / Self Care | Payer: Self-pay | Source: Home / Self Care | Attending: Orthopedic Surgery

## 2021-03-10 DIAGNOSIS — Z79899 Other long term (current) drug therapy: Secondary | ICD-10-CM | POA: Diagnosis not present

## 2021-03-10 DIAGNOSIS — Z882 Allergy status to sulfonamides status: Secondary | ICD-10-CM | POA: Insufficient documentation

## 2021-03-10 DIAGNOSIS — Z880 Allergy status to penicillin: Secondary | ICD-10-CM | POA: Diagnosis not present

## 2021-03-10 DIAGNOSIS — Z881 Allergy status to other antibiotic agents status: Secondary | ICD-10-CM | POA: Diagnosis not present

## 2021-03-10 DIAGNOSIS — Z96612 Presence of left artificial shoulder joint: Secondary | ICD-10-CM

## 2021-03-10 DIAGNOSIS — M19012 Primary osteoarthritis, left shoulder: Secondary | ICD-10-CM | POA: Diagnosis not present

## 2021-03-10 HISTORY — PX: TOTAL SHOULDER ARTHROPLASTY: SHX126

## 2021-03-10 LAB — ABO/RH: ABO/RH(D): O POS

## 2021-03-10 SURGERY — ARTHROPLASTY, SHOULDER, TOTAL
Anesthesia: General | Site: Shoulder | Laterality: Left

## 2021-03-10 MED ORDER — TRANEXAMIC ACID-NACL 1000-0.7 MG/100ML-% IV SOLN
1000.0000 mg | INTRAVENOUS | Status: AC
  Administered 2021-03-10: 1000 mg via INTRAVENOUS
  Filled 2021-03-10: qty 100

## 2021-03-10 MED ORDER — DEXAMETHASONE SODIUM PHOSPHATE 10 MG/ML IJ SOLN
INTRAMUSCULAR | Status: DC | PRN
  Administered 2021-03-10: 5 mg via INTRAVENOUS

## 2021-03-10 MED ORDER — OXYCODONE HCL 5 MG PO TABS: 5.0000 mg | ORAL_TABLET | Freq: Once | ORAL | Status: DC | PRN

## 2021-03-10 MED ORDER — LIDOCAINE HCL (PF) 2 % IJ SOLN
INTRAMUSCULAR | Status: AC
  Filled 2021-03-10: qty 5

## 2021-03-10 MED ORDER — BUPIVACAINE-EPINEPHRINE (PF) 0.5% -1:200000 IJ SOLN
INTRAMUSCULAR | Status: DC | PRN
  Administered 2021-03-10: 15 mL via PERINEURAL

## 2021-03-10 MED ORDER — ONDANSETRON HCL 4 MG/2ML IJ SOLN
4.0000 mg | Freq: Once | INTRAMUSCULAR | Status: DC | PRN
Start: 2021-03-10 — End: 2021-03-10

## 2021-03-10 MED ORDER — MEPERIDINE HCL 50 MG/ML IJ SOLN: 6.2500 mg | INTRAMUSCULAR | Status: DC | PRN

## 2021-03-10 MED ORDER — PHENYLEPHRINE HCL-NACL 20-0.9 MG/250ML-% IV SOLN
INTRAVENOUS | Status: DC | PRN
  Administered 2021-03-10: 30 ug/min via INTRAVENOUS

## 2021-03-10 MED ORDER — FENTANYL CITRATE PF 50 MCG/ML IJ SOSY
50.0000 ug | PREFILLED_SYRINGE | INTRAMUSCULAR | Status: DC
  Administered 2021-03-10: 50 ug via INTRAVENOUS
  Filled 2021-03-10: qty 2

## 2021-03-10 MED ORDER — TIZANIDINE HCL 2 MG PO TABS: 2.0000 mg | ORAL_TABLET | Freq: Three times a day (TID) | ORAL | 0 refills | Status: AC | PRN

## 2021-03-10 MED ORDER — ACETAMINOPHEN 325 MG PO TABS: 325.0000 mg | ORAL_TABLET | ORAL | Status: DC | PRN

## 2021-03-10 MED ORDER — PROPOFOL 10 MG/ML IV BOLUS
INTRAVENOUS | Status: AC
  Filled 2021-03-10: qty 20

## 2021-03-10 MED ORDER — ACETAMINOPHEN 160 MG/5ML PO SOLN: 325.0000 mg | ORAL | Status: DC | PRN

## 2021-03-10 MED ORDER — 0.9 % SODIUM CHLORIDE (POUR BTL) OPTIME
TOPICAL | Status: DC | PRN
  Administered 2021-03-10: 1000 mL

## 2021-03-10 MED ORDER — OXYCODONE-ACETAMINOPHEN 5-325 MG PO TABS: 1.0000 | ORAL_TABLET | Freq: Four times a day (QID) | ORAL | 0 refills | Status: AC | PRN

## 2021-03-10 MED ORDER — PHENYLEPHRINE HCL (PRESSORS) 10 MG/ML IV SOLN
INTRAVENOUS | Status: AC
  Filled 2021-03-10: qty 2

## 2021-03-10 MED ORDER — MIDAZOLAM HCL 2 MG/2ML IJ SOLN
1.0000 mg | INTRAMUSCULAR | Status: DC
  Administered 2021-03-10: 2 mg via INTRAVENOUS
  Filled 2021-03-10: qty 2

## 2021-03-10 MED ORDER — ROCURONIUM BROMIDE 10 MG/ML (PF) SYRINGE
PREFILLED_SYRINGE | INTRAVENOUS | Status: AC
  Filled 2021-03-10: qty 10

## 2021-03-10 MED ORDER — LIDOCAINE HCL (CARDIAC) PF 100 MG/5ML IV SOSY
PREFILLED_SYRINGE | INTRAVENOUS | Status: DC | PRN
  Administered 2021-03-10: 40 mg via INTRAVENOUS

## 2021-03-10 MED ORDER — PROPOFOL 10 MG/ML IV BOLUS
INTRAVENOUS | Status: DC | PRN
  Administered 2021-03-10: 170 mg via INTRAVENOUS

## 2021-03-10 MED ORDER — ONDANSETRON HCL 4 MG/2ML IJ SOLN
INTRAMUSCULAR | Status: DC | PRN
Start: 2021-03-10 — End: 2021-03-10
  Administered 2021-03-10: 4 mg via INTRAVENOUS

## 2021-03-10 MED ORDER — BUPIVACAINE LIPOSOME 1.3 % IJ SUSP
INTRAMUSCULAR | Status: DC | PRN
  Administered 2021-03-10: 10 mL via PERINEURAL

## 2021-03-10 MED ORDER — PHENYLEPHRINE 40 MCG/ML (10ML) SYRINGE FOR IV PUSH (FOR BLOOD PRESSURE SUPPORT)
PREFILLED_SYRINGE | INTRAVENOUS | Status: DC | PRN
  Administered 2021-03-10 (×2): 100 ug via INTRAVENOUS
  Administered 2021-03-10: 80 ug via INTRAVENOUS

## 2021-03-10 MED ORDER — ROCURONIUM BROMIDE 10 MG/ML (PF) SYRINGE
PREFILLED_SYRINGE | INTRAVENOUS | Status: DC | PRN
  Administered 2021-03-10: 60 mg via INTRAVENOUS

## 2021-03-10 MED ORDER — WATER FOR IRRIGATION, STERILE IR SOLN
Status: DC | PRN
  Administered 2021-03-10: 2000 mL

## 2021-03-10 MED ORDER — SUGAMMADEX SODIUM 200 MG/2ML IV SOLN
INTRAVENOUS | Status: DC | PRN
  Administered 2021-03-10: 200 mg via INTRAVENOUS

## 2021-03-10 MED ORDER — SCOPOLAMINE 1 MG/3DAYS TD PT72
MEDICATED_PATCH | TRANSDERMAL | Status: DC | PRN
  Administered 2021-03-10: 1 via TRANSDERMAL

## 2021-03-10 MED ORDER — ONDANSETRON HCL 4 MG/2ML IJ SOLN
INTRAMUSCULAR | Status: AC
  Filled 2021-03-10: qty 2

## 2021-03-10 MED ORDER — SCOPOLAMINE 1 MG/3DAYS TD PT72
MEDICATED_PATCH | TRANSDERMAL | Status: AC
  Filled 2021-03-10: qty 1

## 2021-03-10 MED ORDER — SODIUM CHLORIDE 0.9 % IR SOLN
Status: DC | PRN
  Administered 2021-03-10: 1000 mL

## 2021-03-10 MED ORDER — FENTANYL CITRATE PF 50 MCG/ML IJ SOSY: 25.0000 ug | PREFILLED_SYRINGE | INTRAMUSCULAR | Status: DC | PRN

## 2021-03-10 MED ORDER — VANCOMYCIN HCL IN DEXTROSE 1-5 GM/200ML-% IV SOLN
1000.0000 mg | INTRAVENOUS | Status: AC
  Administered 2021-03-10: 1000 mg via INTRAVENOUS
  Filled 2021-03-10: qty 200

## 2021-03-10 MED ORDER — DIPHENHYDRAMINE HCL 50 MG/ML IJ SOLN
INTRAMUSCULAR | Status: DC | PRN
  Administered 2021-03-10: 12.5 mg via INTRAVENOUS

## 2021-03-10 MED ORDER — HEMOSTATIC AGENTS (NO CHARGE) OPTIME
TOPICAL | Status: DC | PRN
  Administered 2021-03-10: 1 via TOPICAL

## 2021-03-10 MED ORDER — OXYCODONE HCL 5 MG/5ML PO SOLN
5.0000 mg | Freq: Once | ORAL | Status: DC | PRN
Start: 2021-03-10 — End: 2021-03-10

## 2021-03-10 MED ORDER — ORAL CARE MOUTH RINSE: 15.0000 mL | Freq: Once | OROMUCOSAL | Status: AC

## 2021-03-10 MED ORDER — CHLORHEXIDINE GLUCONATE 0.12 % MT SOLN
15.0000 mL | Freq: Once | OROMUCOSAL | Status: AC
  Administered 2021-03-10: 15 mL via OROMUCOSAL

## 2021-03-10 MED ORDER — LACTATED RINGERS IV SOLN: INTRAVENOUS | Status: DC

## 2021-03-10 SURGICAL SUPPLY — 67 items
AID PSTN UNV HD RSTRNT DISP (MISCELLANEOUS) ×1
BAG COUNTER SPONGE SURGICOUNT (BAG) ×1 IMPLANT
BAG SPEC THK2 15X12 ZIP CLS (MISCELLANEOUS)
BAG SPNG CNTER NS LX DISP (BAG) ×1
BAG ZIPLOCK 12X15 (MISCELLANEOUS) ×1 IMPLANT
BIT DRILL 1.6MX128 (BIT) ×2 IMPLANT
BLADE SAW SAG 73X25 THK (BLADE) ×1
BLADE SAW SGTL 73X25 THK (BLADE) ×1 IMPLANT
CEMENT BONE DEPUY (Cement) ×2 IMPLANT
COOLER ICEMAN CLASSIC (MISCELLANEOUS) ×1 IMPLANT
COVER BACK TABLE 60X90IN (DRAPES) ×2 IMPLANT
COVER SURGICAL LIGHT HANDLE (MISCELLANEOUS) ×2 IMPLANT
DRAPE INCISE IOBAN 66X45 STRL (DRAPES) ×2 IMPLANT
DRAPE ORTHO SPLIT 77X108 STRL (DRAPES) ×4
DRAPE POUCH INSTRU U-SHP 10X18 (DRAPES) ×2 IMPLANT
DRAPE SURG 17X11 SM STRL (DRAPES) ×2 IMPLANT
DRAPE SURG ORHT 6 SPLT 77X108 (DRAPES) ×2 IMPLANT
DRAPE TOP 10253 STERILE (DRAPES) ×2 IMPLANT
DRAPE U-SHAPE 47X51 STRL (DRAPES) ×2 IMPLANT
DRSG AQUACEL AG ADV 3.5X 6 (GAUZE/BANDAGES/DRESSINGS) ×2 IMPLANT
DURAPREP 26ML APPLICATOR (WOUND CARE) ×2 IMPLANT
ELECT BLADE TIP CTD 4 INCH (ELECTRODE) ×2 IMPLANT
ELECT REM PT RETURN 15FT ADLT (MISCELLANEOUS) ×2 IMPLANT
GLENOID PEG SHOULDER 40MM SML (Shoulder) ×1 IMPLANT
GLOVE SRG 8 PF TXTR STRL LF DI (GLOVE) ×1 IMPLANT
GLOVE SURG ENC MOIS LTX SZ7.5 (GLOVE) ×2 IMPLANT
GLOVE SURG POLYISO LF SZ6.5 (GLOVE) ×2 IMPLANT
GLOVE SURG UNDER POLY LF SZ6.5 (GLOVE) ×2 IMPLANT
GLOVE SURG UNDER POLY LF SZ8 (GLOVE) ×2
GOWN STRL REUS W/TWL LRG LVL3 (GOWN DISPOSABLE) ×2 IMPLANT
GOWN STRL REUS W/TWL XL LVL3 (GOWN DISPOSABLE) ×2 IMPLANT
GUIDEWIRE GLENOID 2.5X220 (WIRE) ×1 IMPLANT
HANDPIECE INTERPULSE COAX TIP (DISPOSABLE) ×2
HEAD HUM AEQ OFF 48X18X4 (Head) ×1 IMPLANT
HEMOSTAT SURGICEL 2X14 (HEMOSTASIS) ×2 IMPLANT
HOOD PEEL AWAY FLYTE STAYCOOL (MISCELLANEOUS) ×5 IMPLANT
KIT BASIN OR (CUSTOM PROCEDURE TRAY) ×2 IMPLANT
KIT TURNOVER KIT A (KITS) ×2 IMPLANT
MANIFOLD NEPTUNE II (INSTRUMENTS) ×2 IMPLANT
NDL TROCAR POINT SZ 2 1/2 (NEEDLE) ×1 IMPLANT
NEEDLE TROCAR POINT SZ 2 1/2 (NEEDLE) ×2 IMPLANT
NS IRRIG 1000ML POUR BTL (IV SOLUTION) ×1 IMPLANT
PACK SHOULDER (CUSTOM PROCEDURE TRAY) ×2 IMPLANT
PAD COLD SHLDR WRAP-ON (PAD) ×2 IMPLANT
PROTECTOR NERVE ULNAR (MISCELLANEOUS) IMPLANT
RESTRAINT HEAD UNIVERSAL NS (MISCELLANEOUS) ×2 IMPLANT
RETRIEVER SUT HEWSON (MISCELLANEOUS) ×2 IMPLANT
SET HNDPC FAN SPRY TIP SCT (DISPOSABLE) ×1 IMPLANT
SHOULDER GLENOID PEG 40MM SML (Shoulder) ×2 IMPLANT
SLING ARM IMMOBILIZER LRG (SOFTGOODS) ×1 IMPLANT
SMARTMIX MINI TOWER (MISCELLANEOUS) ×2
SPONGE T-LAP 18X18 ~~LOC~~+RFID (SPONGE) ×2 IMPLANT
SPONGE T-LAP 4X18 ~~LOC~~+RFID (SPONGE) ×2 IMPLANT
STEM HUMERAL PTC SZ1C 66 1275D (Stem) ×1 IMPLANT
STRIP CLOSURE SKIN 1/2X4 (GAUZE/BANDAGES/DRESSINGS) ×3 IMPLANT
SUCTION FRAZIER HANDLE 12FR (TUBING) ×2
SUCTION TUBE FRAZIER 12FR DISP (TUBING) ×1 IMPLANT
SUPPORT WRAP ARM LG (MISCELLANEOUS) ×2 IMPLANT
SUT ETHIBOND 2 V 37 (SUTURE) ×4 IMPLANT
SUT MNCRL AB 4-0 PS2 18 (SUTURE) ×2 IMPLANT
SUT VIC AB 2-0 CT1 27 (SUTURE) ×4
SUT VIC AB 2-0 CT1 TAPERPNT 27 (SUTURE) ×2 IMPLANT
TAPE LABRALWHITE 1.5X36 (TAPE) ×2 IMPLANT
TAPE SUT LABRALTAP WHT/BLK (SUTURE) ×2 IMPLANT
TOWEL OR 17X26 10 PK STRL BLUE (TOWEL DISPOSABLE) ×2 IMPLANT
TOWER SMARTMIX MINI (MISCELLANEOUS) ×1 IMPLANT
WATER STERILE IRR 1000ML POUR (IV SOLUTION) ×1 IMPLANT

## 2021-03-10 NOTE — Anesthesia Procedure Notes (Addendum)
  Anesthesia Regional Block: Interscalene brachial plexus block   Pre-Anesthetic Checklist: , timeout performed,  Correct Patient, Correct Site, Correct Laterality,  Correct Procedure, Correct Position, site marked,  Risks and benefits discussed,  Surgical consent,  Pre-op evaluation,  At surgeon's request and post-op pain management  Laterality: Left  Prep: chloraprep       Needles:  Injection technique: Single-shot  Needle Type: Echogenic Stimulator Needle     Needle Length: 5cm  Needle Gauge: 22     Additional Needles:   Procedures:, nerve stimulator,,, ultrasound used (permanent image in chart),,     Nerve Stimulator or Paresthesia:  Response: hand, 0.45 mA  Additional Responses:   Narrative:  Start time: 03/10/2021 8:56 AM End time: 03/10/2021 9:00 AM Injection made incrementally with aspirations every 5 mL.  Performed by: Personally  Anesthesiologist: Bethena Midget, MD  Additional Notes: Functioning IV was confirmed and monitors were applied.  A 66mm 22ga Arrow echogenic stimulator needle was used. Sterile prep and drape,hand hygiene and sterile gloves were used. Ultrasound guidance: relevant anatomy identified, needle position confirmed, local anesthetic spread visualized around nerve(s)., vascular puncture avoided.  Image printed for medical record. Negative aspiration and negative test dose prior to incremental administration of local anesthetic. The patient tolerated the procedure well.

## 2021-03-10 NOTE — Discharge Instructions (Signed)
Discharge Instructions after Total Shoulder Arthroplasty   A sling has been provided for you. Remove the sling 5 times each day to perform motion exercises. After the first 48 to 72 hours, discontinue using the sling. You should use the sling as a protective device, if you are in a crowd.  Use ice on the shoulder intermittently over the first 48 hours after surgery.  Pain medication has been prescribed for you.  Use your medication liberally over the first 48 hours, and then begin to taper your use. You may take Extra Strength Tylenol or Tylenol only in place of the pain pills. DO NOT take ANY nonsteroidal anti-inflammatory pain medications: Advil, Motrin, Ibuprofen, Aleve, Naproxen, or Naprosyn. Take one aspirin 81mg a day for 2 weeks after surgery, unless you have an aspirin sensitivity/allergy or asthma. Leave your dressing on until your first follow up visit.  You may shower with the dressing.  Hold your arm as if you still have your sling on while you shower. Active reaching and lifting are not permitted. You may use the operative arm for activities of daily living that do not require the operative arm to leave the side of the body, such as eating, drinking, bathing, etc.  Three to 5 times each day you should perform assisted overhead reaching and external rotation (outward turning) exercises with the operative arm. You were taught these exercises prior to discharge. Both exercises should be done with the non-operative arm used as the "therapist arm" while the operative arm remains relaxed. Ten of each exercise should be done three to five times each day.   Overhead reach is helping to lift your stiff arm up as high as it will go. To stretch your overhead reach, lie flat on your back, relax, and grasp the wrist of the tight shoulder with your opposite hand. Using the power in your opposite arm, bring the stiff arm up as far as it is comfortable. Start holding it for ten seconds and then work up to  where you can hold it for a count of 30. Breathe slowly and deeply while the arm is moved. Repeat this stretch ten times, trying to help the ar up a little higher each time.     External rotation is turning the arm out to the side while your elbow stays close to your body. External rotation is best stretched while you are lying on your back. Hold a cane, yardstick, broom handle, or dowel in both hands. Bend both elbows to a right angle. Use steady, gentle force from your normal arm to rotate the hand of the stiff shoulder out away from your body. Continue the rotation until it is straight in front of you holding it there for a count of 10. Do not go beyond this level of rotation until seen back by Dr. Chandler. Repeat this exercise ten times slowly.      Please call [phone] during normal business hours or [phone] after hours for any problems. Including the following:  - excessive redness of the incisions - drainage for more than 4 days - fever of more than 101.5 F  *Please note that pain medications will not be refilled after hours or on weekends.    

## 2021-03-10 NOTE — Anesthesia Postprocedure Evaluation (Signed)
Anesthesia Post Note  Patient: Heidi King  Procedure(s) Performed: TOTAL SHOULDER ARTHROPLASTY (Left: Shoulder)     Patient location during evaluation: PACU Anesthesia Type: General Level of consciousness: awake and alert Pain management: pain level controlled Vital Signs Assessment: post-procedure vital signs reviewed and stable Respiratory status: spontaneous breathing, nonlabored ventilation, respiratory function stable and patient connected to nasal cannula oxygen Cardiovascular status: blood pressure returned to baseline and stable Postop Assessment: no apparent nausea or vomiting Anesthetic complications: no   No notable events documented.  Last Vitals:  Vitals:   03/10/21 1145 03/10/21 1200  BP: (!) 149/75 115/88  Pulse: 79 79  Resp: 14   Temp:    SpO2: 91% 94%    Last Pain:  Vitals:   03/10/21 1200  PainSc: 0-No pain                 Lenola Lockner

## 2021-03-10 NOTE — Anesthesia Procedure Notes (Signed)
Procedure Name: Intubation Date/Time: 03/10/2021 9:33 AM Performed by: Raenette Rover, CRNA Pre-anesthesia Checklist: Patient identified, Emergency Drugs available, Suction available and Patient being monitored Patient Re-evaluated:Patient Re-evaluated prior to induction Oxygen Delivery Method: Circle system utilized Preoxygenation: Pre-oxygenation with 100% oxygen Induction Type: IV induction Ventilation: Mask ventilation without difficulty Laryngoscope Size: Mac and 3 Grade View: Grade I Tube type: Oral Tube size: 7.0 mm Number of attempts: 1 Airway Equipment and Method: Stylet Placement Confirmation: ETT inserted through vocal cords under direct vision, positive ETCO2 and breath sounds checked- equal and bilateral Secured at: 22 cm Tube secured with: Tape Dental Injury: Teeth and Oropharynx as per pre-operative assessment

## 2021-03-10 NOTE — H&P (Signed)
Heidi King is an 60 y.o. female.   Chief Complaint: L shoulder pain and dysfunction HPI: Endstage L shoulder arthritis with significant pain and dysfunction, failed conservative measures.  Pain interferes with sleep and quality of life.   Past Medical History:  Diagnosis Date   Anxiety    Asthma    Complication of anesthesia    Depression    PONV (postoperative nausea and vomiting)     Past Surgical History:  Procedure Laterality Date   COLONOSCOPY  2014   KNEE ARTHROSCOPY Bilateral 2019    Family History  Problem Relation Age of Onset   Breast cancer Mother    Breast cancer Maternal Aunt    Social History:  reports that she has never smoked. She has never used smokeless tobacco. She reports that she does not drink alcohol and does not use drugs.  Allergies:  Allergies  Allergen Reactions   Augmentin [Amoxicillin-Pot Clavulanate] Nausea And Vomiting   Doxycycline Hyclate Nausea And Vomiting   Sulfa Antibiotics Hives    Medications Prior to Admission  Medication Sig Dispense Refill   albuterol (VENTOLIN HFA) 108 (90 Base) MCG/ACT inhaler Inhale 1-2 puffs into the lungs every 6 (six) hours as needed for wheezing or shortness of breath.     ALPRAZolam (XANAX) 0.25 MG tablet Take 0.25 mg by mouth in the morning, at noon, and at bedtime.     [employer] DICLOFENAC SODIUM 1 % GEL Apply 2 g topically daily as needed for pain.     gabapentin (NEURONTIN) 400 MG capsule Take 800 mg by mouth 3 (three) times daily.     Lactobacillus-Inulin (CULTURELLE DIGESTIVE HEALTH PO) Take 1 capsule by mouth daily.     Multiple Vitamins-Minerals (CENTRUM SILVER 50+WOMEN) TABS Take 1 tablet by mouth daily.     Multiple Vitamins-Minerals (HAIR/SKIN/NAILS) TABS Take 1 tablet by mouth daily.     PREMPRO 0.625-2.5 MG tablet Take 1 tablet by mouth at bedtime.     sertraline (ZOLOFT) 50 MG tablet Take 50 mg by mouth daily.     tiZANidine (ZANAFLEX) 2 MG tablet Take 2 mg by mouth at bedtime.     traMADol  (ULTRAM) 50 MG tablet Take 1-2 tablets (50-100 mg total) by mouth every 6 (six) hours as needed for pain. (Patient not taking: No sig reported) 30 tablet 1    Results for orders placed or performed during the hospital encounter of 03/10/21 (from the past 48 hour(s))  ABO/Rh     Status: None   Collection Time: 03/10/21  7:18 AM  Result Value Ref Range   ABO/RH(D)      O POS Performed at [facility], [address] [place], [place] [address]    No results found.  Review of Systems  All other systems reviewed and are negative.  Blood pressure 134/75, pulse 80, temperature 98.3 F (36.8 C), resp. rate 18, last menstrual period 06/15/2014, SpO2 100 %. Physical Exam HENT:     Head: Atraumatic.  Eyes:     Extraocular Movements: Extraocular movements intact.  Cardiovascular:     Pulses: Normal pulses.  Musculoskeletal:     Comments: L shoulder pain with ROM, NVID  Skin:    General: Skin is warm.  Neurological:     Mental Status: She is alert.  Psychiatric:        Mood and Affect: Mood normal.     Assessment/Plan L shoulder endstage arthritis failed conservative treatment Plan L TSA Risks / benefits of surgery discussed Consent  on chart  NPO for OR Preop antibiotics   Glennon Hamilton, MD 03/10/2021, 8:42 AM

## 2021-03-10 NOTE — Anesthesia Preprocedure Evaluation (Addendum)
Anesthesia Evaluation  Patient identified by MRN, date of birth, ID band Patient awake    Reviewed: Allergy & Precautions, H&P , NPO status , Patient's Chart, lab work & pertinent test results, reviewed documented beta blocker date and time   History of Anesthesia Complications (+) PONV and history of anesthetic complications  Airway Mallampati: II  TM Distance: >3 FB Neck ROM: full    Dental no notable dental hx. (+) Teeth Intact, Dental Advisory Given, Missing,    Pulmonary asthma ,    Pulmonary exam normal breath sounds clear to auscultation       Cardiovascular Exercise Tolerance: Good negative cardio ROS   Rhythm:regular Rate:Normal     Neuro/Psych PSYCHIATRIC DISORDERS Anxiety Depression negative neurological ROS     GI/Hepatic negative GI ROS, Neg liver ROS,   Endo/Other  negative endocrine ROS  Renal/GU negative Renal ROS  negative genitourinary   Musculoskeletal   Abdominal   Peds  Hematology negative hematology ROS (+)   Anesthesia Other Findings   Reproductive/Obstetrics negative OB ROS                            Anesthesia Physical Anesthesia Plan  ASA: 2  Anesthesia Plan: General   Post-op Pain Management: GA combined w/ Regional for post-op pain   Induction: Intravenous  PONV Risk Score and Plan: 3 and Ondansetron, Dexamethasone, Scopolamine patch - Pre-op and Midazolam  Airway Management Planned: Oral ETT  Additional Equipment: None  Intra-op Plan:   Post-operative Plan: Extubation in OR  Informed Consent: I have reviewed the patients History and Physical, chart, labs and discussed the procedure including the risks, benefits and alternatives for the proposed anesthesia with the patient or authorized representative who has indicated his/her understanding and acceptance.     Dental Advisory Given  Plan Discussed with: CRNA and  Anesthesiologist  Anesthesia Plan Comments: (Discussed both nerve block for pain relief post-op and GA; including NV, sore throat, dental injury, and pulmonary complications)       Anesthesia Quick Evaluation

## 2021-03-10 NOTE — Progress Notes (Signed)
Assisted Dr. Oddono with left, ultrasound guided, interscalene  block. Side rails up, monitors on throughout procedure. See vital signs in flow sheet. Tolerated Procedure well. 

## 2021-03-10 NOTE — Transfer of Care (Signed)
Immediate Anesthesia Transfer of Care Note  Patient: Heidi King  Procedure(s) Performed: TOTAL SHOULDER ARTHROPLASTY (Left: Shoulder)  Patient Location: PACU  Anesthesia Type:GA combined with regional for post-op pain  Level of Consciousness: awake, alert , oriented, drowsy and patient cooperative  Airway & Oxygen Therapy: Patient Spontanous Breathing and Patient connected to face mask oxygen  Post-op Assessment: Report given to RN and Post -op Vital signs reviewed and stable  Post vital signs: Reviewed and stable  Last Vitals:  Vitals Value Taken Time  BP 135/81 03/10/21 1115  Temp    Pulse 82 03/10/21 1116  Resp 18 03/10/21 1116  SpO2 99 % 03/10/21 1116  Vitals shown include unvalidated device data.  Last Pain:  Vitals:   03/10/21 0721  PainSc: 0-No pain         Complications: No notable events documented.

## 2021-03-10 NOTE — Op Note (Signed)
Procedure(s): TOTAL SHOULDER ARTHROPLASTY Procedure Note  Ysabelle Goodroe female 60 y.o. 03/10/2021   Preoperative diagnosis: Left shoulder advanced osteoarthritis  Postoperative diagnosis: Same  Procedure(s) and Anesthesia Type:    * TOTAL SHOULDER ARTHROPLASTY - General  Surgeon(s) and Role:    Jones Broom, MD - Primary   Indications:  60 y.o. female  With endstage left shoulder arthritis. Pain and dysfunction interfered with quality of life and nonoperative treatment with activity modification, NSAIDS and injections failed.     Surgeon: Glennon Hamilton   Assistants: Fredia Sorrow PA-C Amber was present and scrubbed throughout the procedure and was essential in positioning, retraction, exposure, and closure)  Anesthesia: General endotracheal anesthesia with preoperative interscalene block given by the attending anesthesiologist    Procedure Detail  TOTAL SHOULDER ARTHROPLASTY  Findings: Tornier flex anatomic press-fit size 1 stem with a 48 high offset head, cemented size small 40 Cortiloc glenoid.   A lesser tuberosity osteotomy was performed and repaired at the conclusion of the procedure.  Estimated Blood Loss:  200 mL         Drains: None   Blood Given: none          Specimens: none        Complications:  * No complications entered in OR log *         Disposition: PACU - hemodynamically stable.         Condition: stable    Procedure:   The patient was identified in the preoperative holding area where I personally marked the operative extremity after verifying with the patient and consent. She  was taken to the operating room where She was transferred to the   operative table.  The patient received an interscalene block in   the holding area by the attending anesthesiologist.  General anesthesia was induced   in the operating room without complication.  The patient did receive IV  Ancef prior to the commencement of the procedure.  The patient was    placed in the beach-chair position with the back raised about 30   degrees.  The nonoperative extremity and head and neck were carefully   positioned and padded protecting against neurovascular compromise.  The   left upper extremity was then prepped and draped in the standard sterile   fashion.    The appropriate operative time-out was performed with   Anesthesia, the perioperative staff, as well as myself and we all agreed   that the left side was the correct operative site.  An approximately   10 cm incision was made from the tip of the coracoid to the center point of the   humerus at the level of the axilla.  Dissection was carried down sharply   through subcutaneous tissues and cephalic vein was identified and taken   laterally with the deltoid.  The pectoralis major was taken medially.  The   upper 1 cm of the pectoralis major was released from its attachment on   the humerus.  The clavipectoral fascia was incised just lateral to the   conjoined tendon.  This incision was carried up to but not into the   coracoacromial ligament.  Digital palpation was used to prove   integrity of the axillary nerve which was protected throughout the   procedure.  Musculocutaneous nerve was not palpated in the operative   field.  Conjoined tendon was then retracted gently medially and the   deltoid laterally.  Anterior circumflex humeral vessels were clamped  and   coagulated.  The soft tissues overlying the biceps was incised and this   incision was carried across the transverse humeral ligament to the base   of the coracoid.  The biceps was noted to be severely degenerated. It was released from the superior labrum. The biceps was then tenodesed to the soft tissue just above   pectoralis major and the remaining portion of the biceps superiorly was   excised.  An osteotomy was performed at the lesser tuberosity.  The capsule was then   released all the way down to the 6 o'clock position of the humeral  head.   The humeral head was then delivered with simultaneous adduction,   extension and external rotation.  All humeral osteophytes were removed   and the anatomic neck of the humerus was marked and cut free hand at   approximately 25 degrees retroversion within about 3 mm of the cuff   reflection posteriorly.  The head size was estimated to be a 48 medium   offset.  At that point, the humeral head was retracted posteriorly with   a Fukuda retractor.   Remaining portion of the capsule was released at the base of the   coracoid.  The remaining biceps anchor and the entire anterior-inferior   labrum was excised.  The posterior labrum was also excised but the   posterior capsule was not released.  The guidepin was placed bicortically with non elevated guide.  The reamer was used to ream to concentric bone with punctate bleeding.  This gave an excellent concentric surface.  The center hole was then drilled for an anchor peg glenoid followed by the three peripheral holes and none of the holes   exited the glenoid wall.  I then pulse irrigated these holes and dried   them with Surgicel.  The three peripheral holes were then   pressurized cemented and the anchor peg glenoid was placed and impacted   with an excellent fit.  The glenoid was a 48 component.  The proximal humerus was then again exposed taking care not to displace the glenoid.    The entry awl was used followed by sounding reamers and then sequentially broached for a size 1 this was then left in place and the calcar planer was used. Trial head was placed with a 48.  With the trial implantation of the component, there was approximately 50% posterior translation with immediate snap back to the   anatomic position.  With forward elevation, there was no tendency   towards posterior subluxation.   The trial was removed and the final implant was prepared on a back table.  The trial was removed and the final implant was prepared on a back table.    3 small holes were drilled on the medial side of the lesser tuberosity osteotomy, through which 2 labral tapes were passed. The implant was then placed through the loop of the 2 labral tapes and impacted with an excellent press-fit. This achieved excellent anatomic reconstruction of the proximal humerus.  The joint was then copiously irrigated with pulse lavage.  The subscapularis and   lesser tuberosity osteotomy were then repaired using the 2 labral tapes previously passed in a double row fashion with horizontal mattress sutures medially brought over through bone tunnels tied over a bone bridge laterally.   One #1 Ethibond was placed at the rotator interval just above   the lesser tuberosity. Copious irrigation was used. Skin was closed with 2-0 Vicryl sutures in  the deep dermal layer and 4-0 Monocryl in a subcuticular  running fashion.  Sterile dressings were then applied including Aquacel.  The patient was placed in a sling and allowed to awaken from general anesthesia and taken to the recovery room in stable condition.      POSTOPERATIVE PLAN:  Early passive range of motion will be allowed with the goal of 0 degrees external rotation and 90 degrees forward elevation.  No internal rotation at this time.  No active motion of the arm until the lesser tuberosity heals.  The patient will be observed in the recovery room today and if her pain is well controlled and she is hemodynamically stable she could be discharged home today with family.

## 2021-03-14 ENCOUNTER — Encounter (HOSPITAL_COMMUNITY): Payer: Self-pay | Admitting: Orthopedic Surgery

## 2021-10-10 IMAGING — DX DG CHEST 2V
2 series · 2 of 2 positions shown · non-contrast
Comparison: 09/30/2018

CLINICAL DATA: Preop evaluation for upcoming surgery, initial
encounter

EXAM:
CHEST - 2 VIEW

[chest pa]
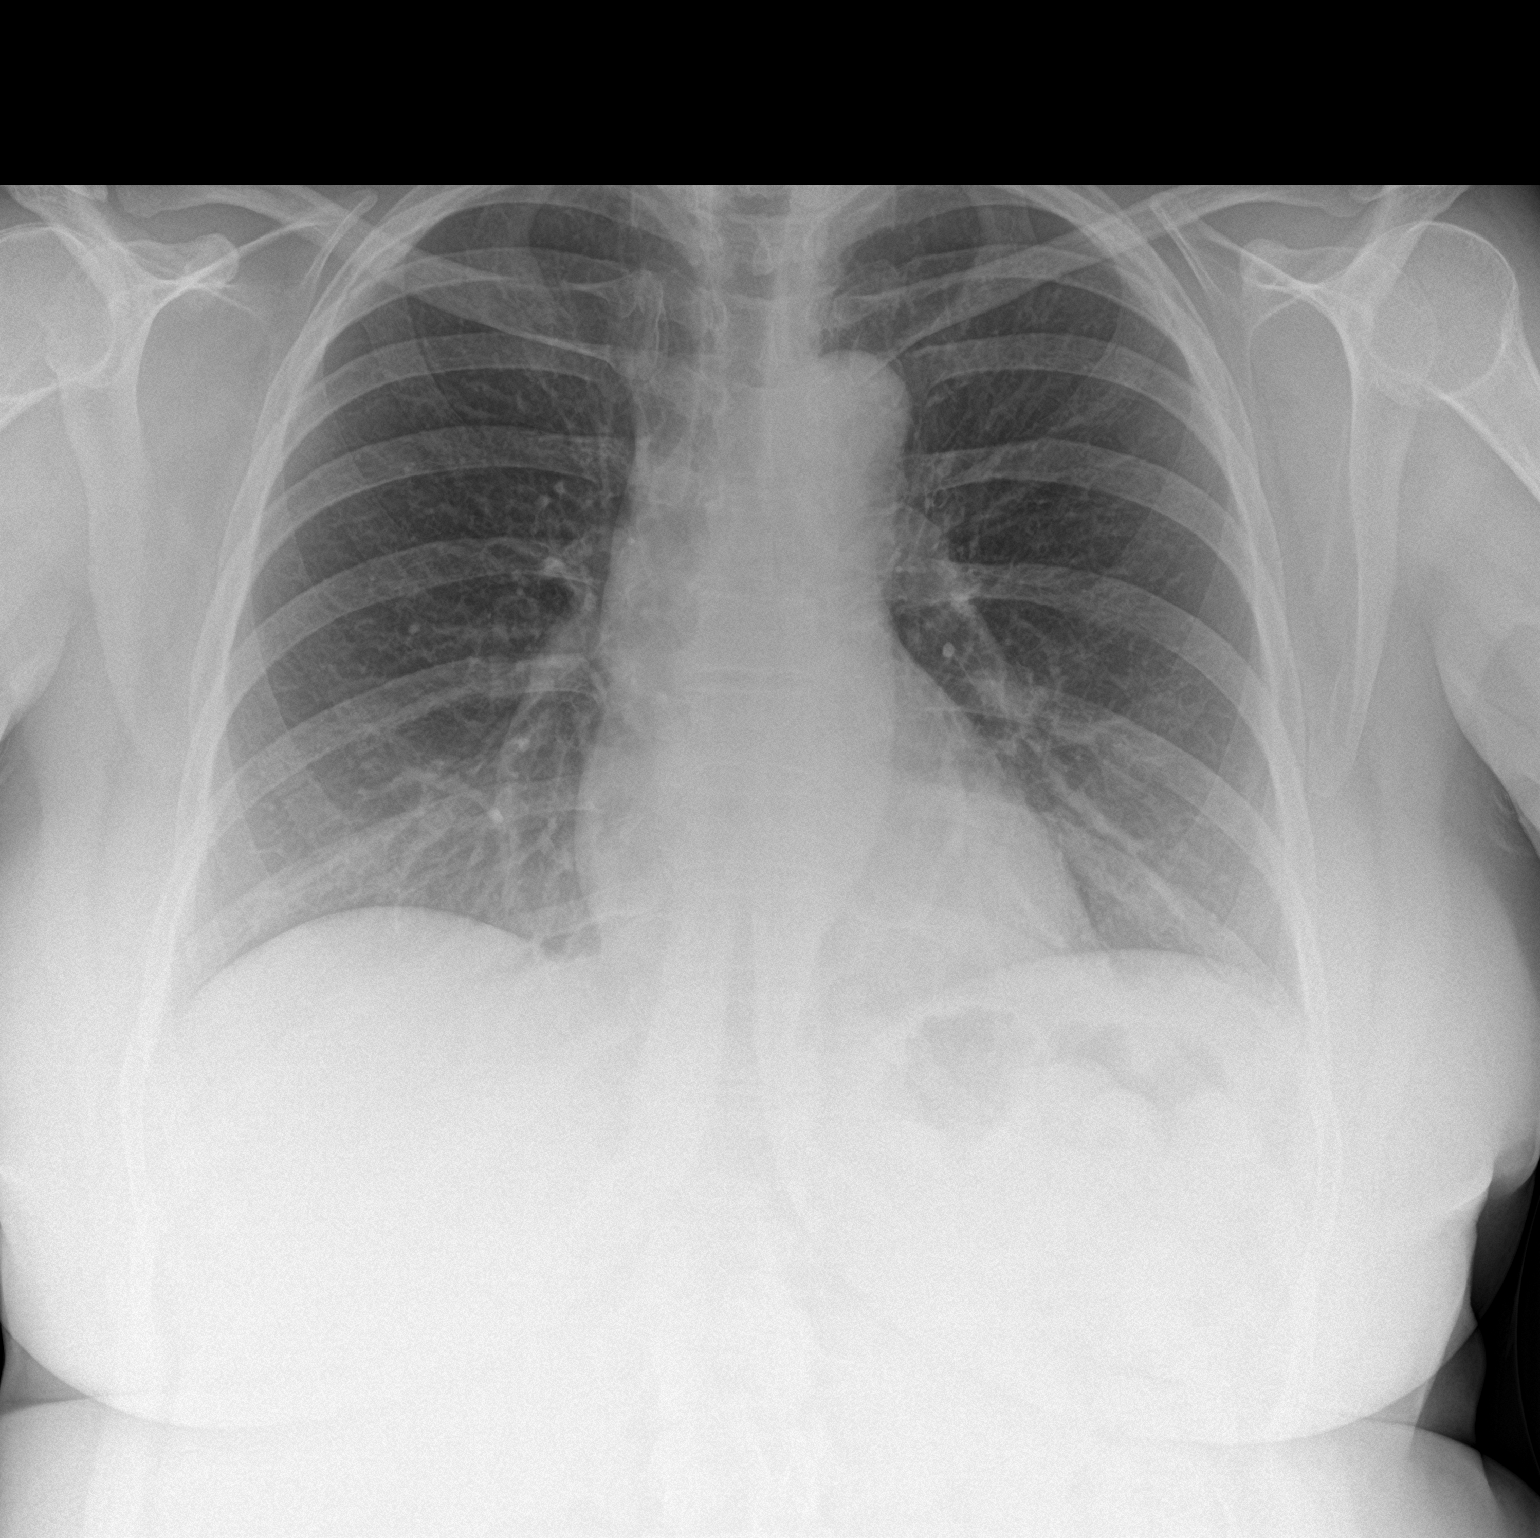

[chest lat]
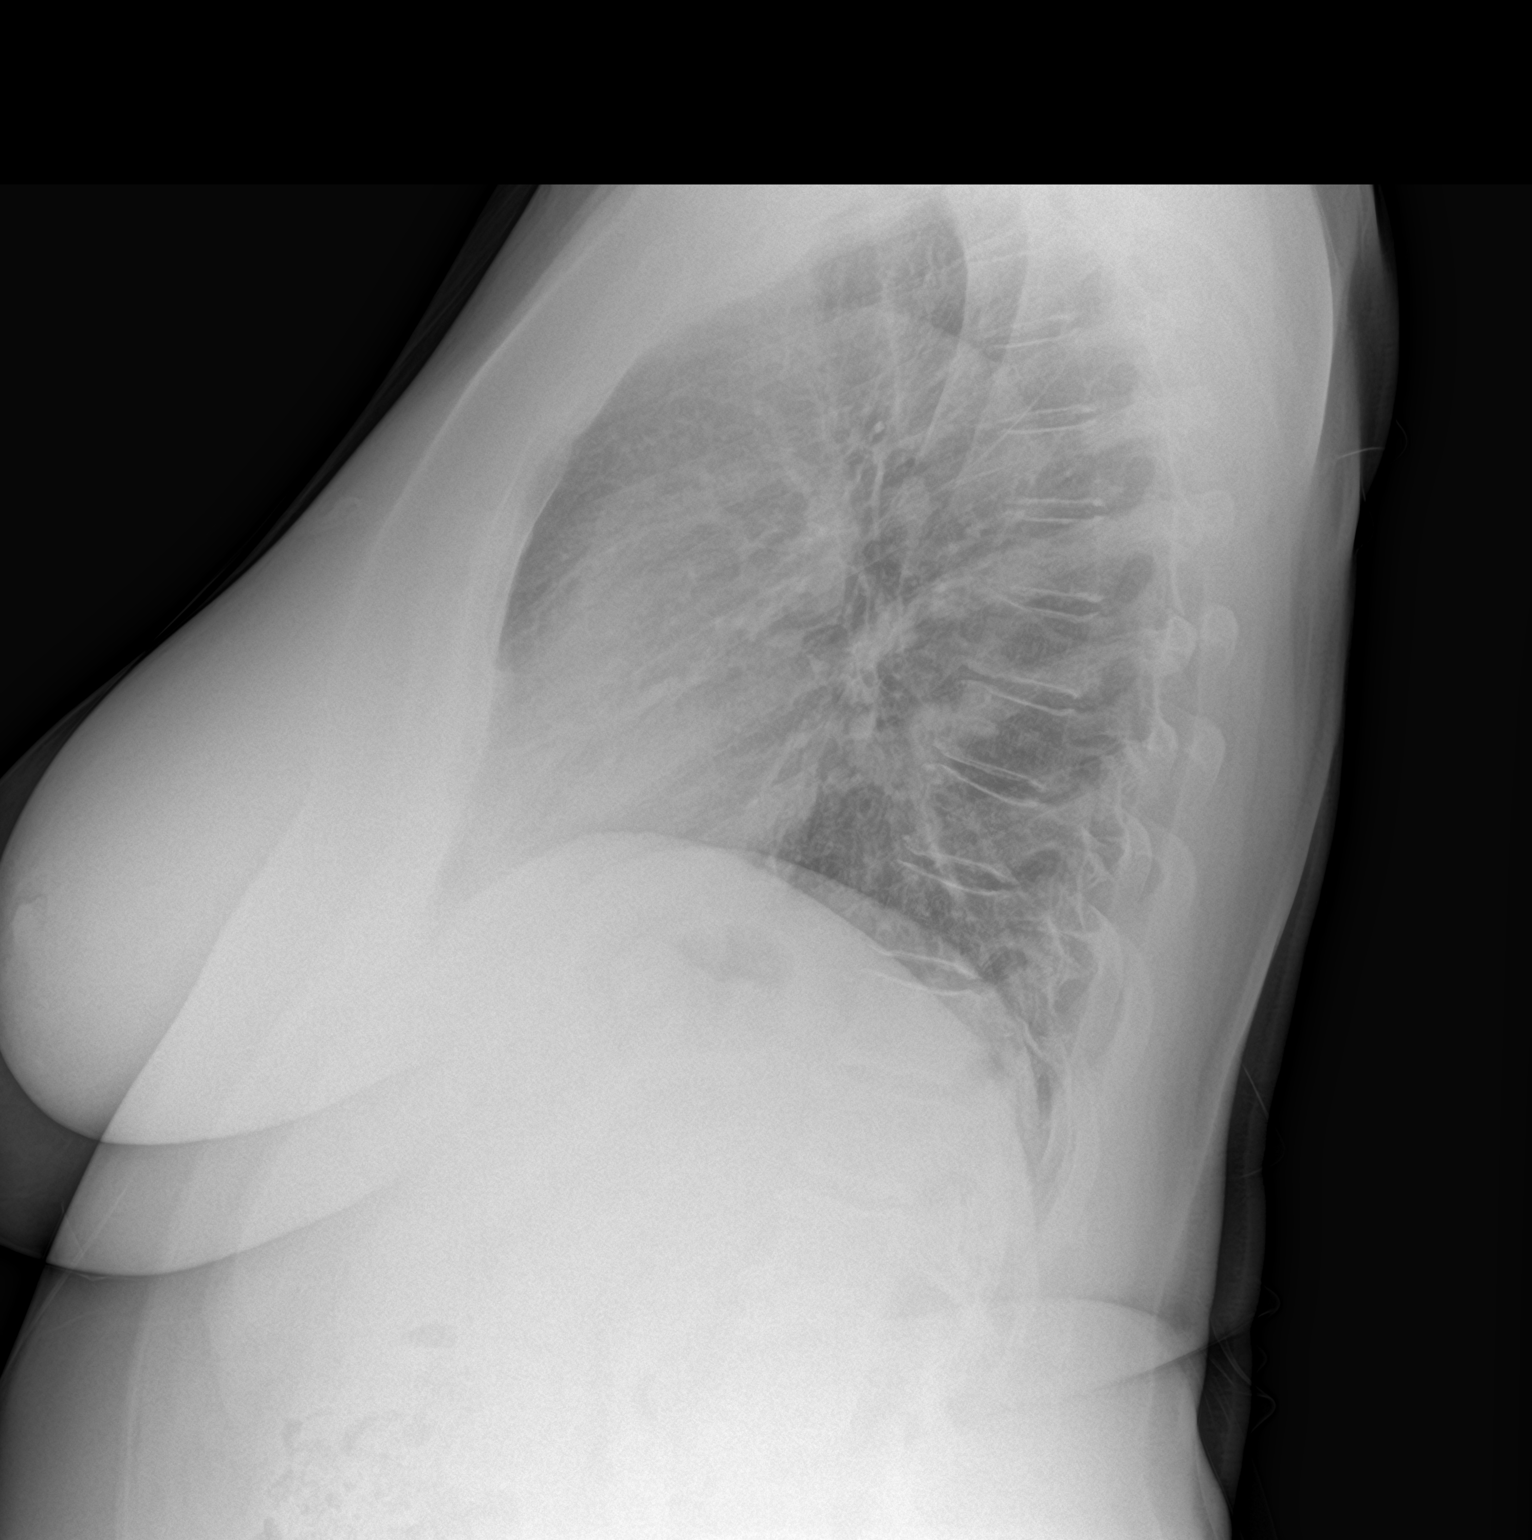

[2 of 2 positions shown; findings below may reference images not displayed]

FINDINGS: The heart size and mediastinal contours are within normal limits.
Both lungs are clear. The visualized skeletal structures are
unremarkable.
IMPRESSION: No active cardiopulmonary disease.

## 2021-12-28 ENCOUNTER — Other Ambulatory Visit: Payer: Self-pay | Admitting: Orthopaedic Surgery

## 2021-12-28 DIAGNOSIS — M79671 Pain in right foot: Secondary | ICD-10-CM

## 2022-01-31 ENCOUNTER — Other Ambulatory Visit: Payer: BC Managed Care – PPO

## 2022-01-31 ENCOUNTER — Ambulatory Visit
Admission: RE | Admit: 2022-01-31 | Discharge: 2022-01-31 | Disposition: A | Discharge status: Home / Self Care | Payer: BC Managed Care – PPO | Source: Ambulatory Visit | Attending: Orthopaedic Surgery | Admitting: Orthopaedic Surgery

## 2022-01-31 DIAGNOSIS — M79671 Pain in right foot: Secondary | ICD-10-CM

## 2022-02-22 ENCOUNTER — Other Ambulatory Visit: Payer: Self-pay | Admitting: Otolaryngology

## 2022-02-22 DIAGNOSIS — H9312 Tinnitus, left ear: Secondary | ICD-10-CM

## 2022-03-11 ENCOUNTER — Ambulatory Visit
Admission: RE | Admit: 2022-03-11 | Discharge: 2022-03-11 | Disposition: A | Discharge status: Home / Self Care | Payer: BC Managed Care – PPO | Source: Ambulatory Visit | Attending: Otolaryngology | Admitting: Otolaryngology

## 2022-03-11 DIAGNOSIS — H9312 Tinnitus, left ear: Secondary | ICD-10-CM

## 2022-03-11 MED ORDER — GADOBENATE DIMEGLUMINE 529 MG/ML IV SOLN
15.0000 mL | Freq: Once | INTRAVENOUS | Status: AC | PRN
  Administered 2022-03-11: 15 mL via INTRAVENOUS

## 2023-12-13 ENCOUNTER — Other Ambulatory Visit: Payer: Self-pay | Admitting: Obstetrics and Gynecology

## 2023-12-13 DIAGNOSIS — N644 Mastodynia: Secondary | ICD-10-CM

## 2023-12-24 ENCOUNTER — Ambulatory Visit

## 2023-12-24 ENCOUNTER — Ambulatory Visit
Admission: RE | Admit: 2023-12-24 | Discharge: 2023-12-24 | Disposition: A | Discharge status: Home / Self Care | Source: Ambulatory Visit | Attending: Obstetrics and Gynecology | Admitting: Obstetrics and Gynecology

## 2023-12-24 DIAGNOSIS — N644 Mastodynia: Secondary | ICD-10-CM

## 2024-06-24 ENCOUNTER — Encounter: Payer: Self-pay | Admitting: Internal Medicine

## 2024-07-04 ENCOUNTER — Encounter: Payer: Self-pay | Admitting: Internal Medicine

## 2024-07-16 ENCOUNTER — Encounter: Payer: Self-pay | Admitting: Internal Medicine

## 2024-07-16 ENCOUNTER — Ambulatory Visit

## 2024-07-16 VITALS — Ht 65.5 in | Wt 199.0 lb

## 2024-07-16 DIAGNOSIS — Z1211 Encounter for screening for malignant neoplasm of colon: Secondary | ICD-10-CM

## 2024-07-16 MED ORDER — NA SULFATE-K SULFATE-MG SULF 17.5-3.13-1.6 GM/177ML PO SOLN: 1.0000 | Freq: Once | ORAL | 0 refills | Status: AC

## 2024-07-16 NOTE — Progress Notes (Signed)

## 2024-07-30 ENCOUNTER — Encounter: Admitting: Internal Medicine
